# Patient Record
Sex: Female | Born: 1990 | Race: White | Hispanic: No | Smoking: Never smoker
Health system: Southern US, Community
[De-identification: ages and names within clinical notes are randomized; demographics above are authoritative.]

## PROBLEM LIST (undated history)

## (undated) ENCOUNTER — Inpatient Hospital Stay (HOSPITAL_COMMUNITY): Payer: Self-pay

## (undated) DIAGNOSIS — Z319 Encounter for procreative management, unspecified: Principal | ICD-10-CM

## (undated) DIAGNOSIS — N390 Urinary tract infection, site not specified: Principal | ICD-10-CM

## (undated) DIAGNOSIS — E669 Obesity, unspecified: Secondary | ICD-10-CM

## (undated) DIAGNOSIS — R12 Heartburn: Secondary | ICD-10-CM

## (undated) DIAGNOSIS — D649 Anemia, unspecified: Secondary | ICD-10-CM

## (undated) DIAGNOSIS — O26899 Other specified pregnancy related conditions, unspecified trimester: Secondary | ICD-10-CM

## (undated) HISTORY — DX: Obesity, unspecified: E66.9

## (undated) HISTORY — DX: Encounter for procreative management, unspecified: Z31.9

## (undated) HISTORY — PX: WISDOM TOOTH EXTRACTION: SHX21

## (undated) HISTORY — DX: Urinary tract infection, site not specified: N39.0

---
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
## 2010-02-18 ENCOUNTER — Emergency Department (HOSPITAL_COMMUNITY): Admission: EM | Admit: 2010-02-18 | Discharge: 2010-02-18 | Payer: Self-pay | Admitting: Emergency Medicine

## 2010-06-19 LAB — URINALYSIS, ROUTINE W REFLEX MICROSCOPIC
Glucose, UA: NEGATIVE mg/dL
Ketones, ur: 15 mg/dL — AB
Protein, ur: 100 mg/dL — AB
Specific Gravity, Urine: >1.03 — ABNORMAL HIGH (ref 1.005–1.030)
Urobilinogen, UA: 0.2 mg/dL (ref 0.0–1.0)

## 2010-06-19 LAB — URINE MICROSCOPIC-ADD ON

## 2010-09-30 ENCOUNTER — Emergency Department (HOSPITAL_COMMUNITY)
Admission: EM | Admit: 2010-09-30 | Discharge: 2010-09-30 | Disposition: A | Discharge status: Home / Self Care | Payer: 59 | Attending: Emergency Medicine | Admitting: Emergency Medicine

## 2010-09-30 DIAGNOSIS — N390 Urinary tract infection, site not specified: Secondary | ICD-10-CM | POA: Insufficient documentation

## 2010-09-30 DIAGNOSIS — R300 Dysuria: Secondary | ICD-10-CM | POA: Insufficient documentation

## 2010-09-30 LAB — URINALYSIS, ROUTINE W REFLEX MICROSCOPIC
Ketones, ur: NEGATIVE mg/dL
Leukocytes, UA: NEGATIVE
Protein, ur: 30 mg/dL — AB
Specific Gravity, Urine: 1.02 (ref 1.005–1.030)
pH: 7 (ref 5.0–8.0)

## 2010-09-30 LAB — URINE MICROSCOPIC-ADD ON

## 2010-09-30 LAB — PREGNANCY, URINE: Preg Test, Ur: NEGATIVE

## 2010-10-03 LAB — URINE CULTURE

## 2012-02-11 ENCOUNTER — Other Ambulatory Visit (HOSPITAL_COMMUNITY)
Admission: RE | Admit: 2012-02-11 | Discharge: 2012-02-11 | Disposition: A | Discharge status: Home / Self Care | Payer: BC Managed Care – PPO | Source: Ambulatory Visit | Attending: Obstetrics and Gynecology | Admitting: Obstetrics and Gynecology

## 2012-02-11 DIAGNOSIS — Z01419 Encounter for gynecological examination (general) (routine) without abnormal findings: Secondary | ICD-10-CM | POA: Insufficient documentation

## 2012-10-12 ENCOUNTER — Telehealth: Payer: Self-pay | Admitting: Adult Health

## 2012-10-13 NOTE — Telephone Encounter (Signed)
Pt said she has been trying to get pregnant without success, she started 7/4 and is going to come in 7/24 for a progesterone level.We weill talk after that with treatment options to get pregnant.

## 2012-10-29 ENCOUNTER — Other Ambulatory Visit: Payer: BC Managed Care – PPO

## 2012-10-29 DIAGNOSIS — IMO0002 Reserved for concepts with insufficient information to code with codable children: Secondary | ICD-10-CM

## 2012-10-29 LAB — PROGESTERONE: Progesterone: 4.9 ng/mL

## 2012-10-30 ENCOUNTER — Telehealth: Payer: Self-pay | Admitting: Obstetrics and Gynecology

## 2012-10-30 NOTE — Telephone Encounter (Signed)
Spoke with pt. Progesterone is 4.9. Spoke with JAG. Pt wants to start Clomid so appt was scheduled to see JAG. JSY

## 2012-11-06 ENCOUNTER — Encounter: Payer: Self-pay | Admitting: Adult Health

## 2012-11-06 ENCOUNTER — Ambulatory Visit (INDEPENDENT_AMBULATORY_CARE_PROVIDER_SITE_OTHER): Payer: BC Managed Care – PPO | Admitting: Adult Health

## 2012-11-06 VITALS — BP 140/84 | Ht 70.0 in | Wt 262.0 lb

## 2012-11-06 DIAGNOSIS — Z319 Encounter for procreative management, unspecified: Secondary | ICD-10-CM

## 2012-11-06 DIAGNOSIS — N979 Female infertility, unspecified: Secondary | ICD-10-CM

## 2012-11-06 MED ORDER — CLOMIPHENE CITRATE 50 MG PO TABS: ORAL_TABLET | ORAL | Status: DC

## 2012-11-06 NOTE — Patient Instructions (Addendum)
Take clomid  Have sex Follow up august 21 for progesterone level

## 2012-11-06 NOTE — Progress Notes (Signed)
Subjective:     Patient ID: Megan Barton, female   DOB: [birth year], 22 y.o.   MRN: [id number]  HPI Megan Barton is in to discuss taking clomid her progesterone level was 4.9 on 7/25.and she is taking prenatals and having sex.  Review of Systems See HPI Reviewed past medical,surgical, social and family history. Reviewed medications and allergies.     Objective:   Physical Exam BP 140/84  Ht 5\' 10"  (1.778 m)  Wt 262 lb (118.842 kg)  BMI 37.59 kg/m2  LMP 11/06/2012   will rx clomid 50 mg 1 on day 3-7 of cycle and have sex every other day, day 7-24 of  Cycle and check progesterone level 8/21.  Assessment:     Desires pregnancy     Plan:      Rx clomid 50 mg # 5 with 2 refills, tale 1 daily  Days 3-7 of cycle   Check progesterone level August 21 Have sex as discussed

## 2012-11-26 ENCOUNTER — Other Ambulatory Visit: Payer: BC Managed Care – PPO

## 2012-11-26 DIAGNOSIS — IMO0002 Reserved for concepts with insufficient information to code with codable children: Secondary | ICD-10-CM

## 2012-11-27 ENCOUNTER — Telehealth: Payer: Self-pay | Admitting: Adult Health

## 2012-11-27 ENCOUNTER — Telehealth: Payer: Self-pay | Admitting: Obstetrics & Gynecology

## 2012-11-27 LAB — PROGESTERONE: Progesterone: 22.4 ng/mL

## 2012-11-27 NOTE — Telephone Encounter (Signed)
Spoke with pt. Progesterone result from yesterday was 22.4. On Clomid 50 mg. This is the 1st month of being on Clomid. Last period was 11/06/12. Spoke with Dr. Despina Hidden. Advised to wait and see if she starts in September. Advised to call us and let us know. Pt voiced understanding. JSY

## 2012-11-27 NOTE — Telephone Encounter (Signed)
Left message that progesterone level was 22.4 which is good

## 2012-12-01 ENCOUNTER — Telehealth: Payer: Self-pay | Admitting: Adult Health

## 2012-12-01 NOTE — Telephone Encounter (Signed)
LMP 8/1 took clomid spotting now pink to brown, just watch if not red by Friday check pregnancy test

## 2012-12-01 NOTE — Telephone Encounter (Signed)
Spoke with pt. Has had dark brown spotting yesterday and today. Urine pregnancy test was negative this am. When does she start Clomid again?

## 2012-12-02 ENCOUNTER — Telehealth: Payer: Self-pay | Admitting: Adult Health

## 2012-12-02 NOTE — Telephone Encounter (Signed)
Spotted the past few days, now having brownish bleeding and sometimes having bright red bleeding, but nothing hits the pad. Having some cramping. Not sure what to do. I spoke with JAG and she advised to wait and take a UPT on Friday if no bright red bleeding, and if bright red bleeding start the clomid. Pt verbalized understanding.

## 2012-12-09 ENCOUNTER — Telehealth: Payer: Self-pay | Admitting: Adult Health

## 2012-12-09 NOTE — Telephone Encounter (Signed)
Get progesterone level 9/17

## 2012-12-23 ENCOUNTER — Other Ambulatory Visit: Payer: BC Managed Care – PPO

## 2012-12-23 DIAGNOSIS — Z319 Encounter for procreative management, unspecified: Secondary | ICD-10-CM

## 2012-12-24 ENCOUNTER — Telehealth: Payer: Self-pay | Admitting: Adult Health

## 2012-12-24 NOTE — Telephone Encounter (Signed)
Pt aware of labs  

## 2012-12-28 ENCOUNTER — Telehealth: Payer: Self-pay | Admitting: Adult Health

## 2012-12-28 NOTE — Telephone Encounter (Signed)
Pt wants to discuss blood work from work, and wants to start taking phentermine. Pt states she thinks her wt has something to do with not getting pregnant.  Spoke with JAG and she advised the pt make an appointment. Pt verbalized understanding. Appointment given

## 2012-12-28 NOTE — Telephone Encounter (Signed)
Keep appt next week

## 2013-01-06 ENCOUNTER — Encounter: Payer: Self-pay | Admitting: Adult Health

## 2013-01-06 ENCOUNTER — Ambulatory Visit (INDEPENDENT_AMBULATORY_CARE_PROVIDER_SITE_OTHER): Payer: BC Managed Care – PPO | Admitting: Adult Health

## 2013-01-06 VITALS — BP 138/78 | Ht 70.0 in | Wt 264.0 lb

## 2013-01-06 DIAGNOSIS — N390 Urinary tract infection, site not specified: Secondary | ICD-10-CM | POA: Insufficient documentation

## 2013-01-06 DIAGNOSIS — E669 Obesity, unspecified: Secondary | ICD-10-CM

## 2013-01-06 DIAGNOSIS — Z319 Encounter for procreative management, unspecified: Secondary | ICD-10-CM

## 2013-01-06 HISTORY — DX: Urinary tract infection, site not specified: N39.0

## 2013-01-06 HISTORY — DX: Obesity, unspecified: E66.9

## 2013-01-06 MED ORDER — NITROFURANTOIN MONOHYD MACRO 100 MG PO CAPS: 100.0000 mg | ORAL_CAPSULE | Freq: Two times a day (BID) | ORAL | Status: DC

## 2013-01-06 NOTE — Patient Instructions (Addendum)
Call in follow up  Calorie Counting Diet A calorie counting diet requires you to eat the number of calories that are right for you in a day. Calories are the measurement of how much energy you get from the food you eat. Eating the right amount of calories is important for staying at a healthy weight. If you eat too many calories, your body will store them as fat and you may gain weight. If you eat too few calories, you may lose weight. Counting the number of calories you eat during a day will help you know if you are eating the right amount. A Registered Dietitian can determine how many calories you need in a day. The amount of calories needed varies from person to person. If your goal is to lose weight, you will need to eat fewer calories. Losing weight can benefit you if you are overweight or have health problems such as heart disease, high blood pressure, or diabetes. If your goal is to gain weight, you will need to eat more calories. Gaining weight may be necessary if you have a certain health problem that causes your body to need more energy. TIPS Whether you are increasing or decreasing the number of calories you eat duri ng a day, it may be hard to get used to changes in what you eat and drink. The following are tips to help you keep track of the number of calories you eat.  Measure foods at home with measuring cups. This helps you know the amount of food and number of calories you are eating.  Restaurants often serve food in amounts that are larger than 1 serving. While eating out, estimate how many servings of a food you are given. For example, a serving of cooked rice is  cup or about the size of half of a fist. Knowing serving sizes will help you be aware of how much food you are eating at restaurants.  Ask for smaller portion sizes or child-size portions at restaurants.  Plan to eat half of a meal at a restaurant. Take the rest home or share the other half with a friend.  Read the  Nutrition Facts panel on food labels for calorie content and serving size. You can find out how many servings are in a package, the size of a serving, and the number of calories each serving has.  For example, a package might contain 3 cookies. The Nutrition Facts panel on that package says that 1 serving is 1 cookie. Below that, it will say there are 3 servings in the container. The calories section of the Nutrition Facts label says there are 90 calories. This means there are 90 calories in 1 cookie (1 serving). If you eat 1 cookie you have eaten 90 calories. If you eat all 3 cookies, you have eaten 270 calories (3 servings x 90 calories = 270 calories). The list below tells you how big or small some common portion sizes are.  1 oz.........4 stacked dice.  3 oz........Marland KitchenDeck of cards.  1 tsp.......Marland KitchenTip of little finger.  1 tbs......Marland KitchenMarland KitchenThumb.  2 tbs.......Marland KitchenGolf ball.   cup......Marland KitchenHalf of a fist.  1 cup.......Marland KitchenA fist. KEEP A FOOD LOG Write down every food item you eat, the amount you eat, and the number of calories in each food you eat during the day. At the end of the day, you can add up the total number of calories you have eaten. It may help to keep a list like the one below. Find out  the calorie information by reading the Nutrition Facts panel on food labels. Breakfast  Bran cereal (1 cup, 110 calories).  Fat-free milk ( cup, 45 calories). Snack  Apple (1 medium, 80 calories). Lunch  Spinach (1 cup, 20 calories).  Tomato ( medium, 20 calories).  Chicken breast strips (3 oz, 165 calories).  Shredded cheddar cheese ( cup, 110 calories).  Light [place] dressing (2 tbs, 60 calories).  Whole-wheat bread (1 slice, 80 calories).  Tub margarine (1 tsp, 35 calories).  Vegetable soup (1 cup, 160 calories). Dinner  Pork chop (3 oz, 190 calories).  Brown rice (1 cup, 215 calories).  Steamed broccoli ( cup, 20 calories).  Strawberries (1  cup, 65 calories).  Whipped  cream (1 tbs, 50 calories). Daily Calorie Total: 1425 Document Released: 03/25/2005 Document Revised: 06/17/2011 Document Reviewed: 09/19/2006 [facility] Patient Information 2014 [place], [place].

## 2013-01-06 NOTE — Progress Notes (Signed)
Subjective:     Patient ID: Megan Barton, female   DOB: 22-Feb-1991, 22 y.o.   MRN: [id number]  HPI Megan Barton is a 22 year old white female who has taken clomid and had progesterone levels that showed that she ovulated but she has not gotten pregnant yet.She had labs drawn and her A1c was 6.2 and her Vitamin D was 24.She did not take clomid this month and wants to try to lose weight.Has had UTI symptoms and took AZO.  Review of Systems See HPI Reviewed past medical,surgical, social and family history. Reviewed medications and allergies.     Objective:   Physical Exam BP 138/78  Ht 5\' 10"  (1.778 m)  Wt 264 lb (119.75 kg)  BMI 37.88 kg/m2  LMP 12/28/2012   urine orange discussed weight loss options and she will try low carb diet, take folic acid or prenatal, continue with timed sex, discussed could take 6-18 months  Assessment:     UTI Obesity Desires pregnancy    Plan:     Rx macrobid 1 bid x 7 days Take folic acid Get progesterone level about October 13 can call back for appt   Review handout on weight loss and getting pregnant

## 2013-01-18 ENCOUNTER — Other Ambulatory Visit: Payer: BC Managed Care – PPO

## 2013-01-18 DIAGNOSIS — Z319 Encounter for procreative management, unspecified: Secondary | ICD-10-CM

## 2013-01-19 ENCOUNTER — Telehealth: Payer: Self-pay | Admitting: Adult Health

## 2013-01-19 LAB — PROGESTERONE: Progesterone: 6.5 ng/mL

## 2013-01-19 NOTE — Telephone Encounter (Signed)
Left message that she could work on diet but if she wanted to try clomid again let us know

## 2013-01-19 NOTE — Telephone Encounter (Signed)
Pt aware of results. Pt stated that she would like to take the clomid again, she just wants to make sure what dosage.

## 2013-02-22 ENCOUNTER — Telehealth: Payer: Self-pay | Admitting: Obstetrics and Gynecology

## 2013-02-22 MED ORDER — CLOMIPHENE CITRATE 50 MG PO TABS: ORAL_TABLET | ORAL | Status: DC

## 2013-02-22 NOTE — Telephone Encounter (Signed)
Pt states started period 3 days ago, was wanting to know did Cyril Mourning, NP going to increase clomid dose?

## 2013-02-22 NOTE — Telephone Encounter (Signed)
Pt wants try clomid again will increase to 100 mg, check progesterone level day 21.

## 2013-03-12 ENCOUNTER — Other Ambulatory Visit: Payer: BC Managed Care – PPO

## 2013-06-09 ENCOUNTER — Other Ambulatory Visit (INDEPENDENT_AMBULATORY_CARE_PROVIDER_SITE_OTHER): Payer: BC Managed Care – PPO | Admitting: *Deleted

## 2013-06-09 DIAGNOSIS — Z3490 Encounter for supervision of normal pregnancy, unspecified, unspecified trimester: Secondary | ICD-10-CM

## 2013-06-09 DIAGNOSIS — Z3201 Encounter for pregnancy test, result positive: Secondary | ICD-10-CM

## 2013-06-09 LAB — POCT URINE PREGNANCY: Preg Test, Ur: POSITIVE

## 2013-06-09 NOTE — Progress Notes (Signed)
Patient has an LMP of May 15, 2013.  She did have a positive pregnancy test but has had some irregular bleeding so she would like to confirm her home test and do blood work to confirm as well.  She works as a Radiation protection practitionerparamedic.  She did try to conceive 18 months with her previous relationship without success but has only been trying for a few weeks this time.  She will return in one week for new ob nurse visit.

## 2013-06-10 ENCOUNTER — Telehealth: Payer: Self-pay | Admitting: *Deleted

## 2013-06-10 LAB — HCG, QUANTITATIVE, PREGNANCY: HCG, BETA CHAIN, QUANT, S: 1549.7 m[IU]/mL

## 2013-06-10 NOTE — Telephone Encounter (Signed)
Patient called to confirm her beta hcg.  Confirmed positive and she will keep her follow up appointment on March 17 for her new ob intake visit.

## 2013-06-17 ENCOUNTER — Inpatient Hospital Stay (HOSPITAL_COMMUNITY): Payer: BC Managed Care – PPO

## 2013-06-17 ENCOUNTER — Inpatient Hospital Stay (HOSPITAL_COMMUNITY)
Admission: AD | Admit: 2013-06-17 | Discharge: 2013-06-17 | Disposition: A | Discharge status: Home / Self Care | Payer: BC Managed Care – PPO | Source: Ambulatory Visit | Attending: Obstetrics & Gynecology | Admitting: Obstetrics & Gynecology

## 2013-06-17 ENCOUNTER — Encounter (HOSPITAL_COMMUNITY): Payer: Self-pay | Admitting: *Deleted

## 2013-06-17 DIAGNOSIS — O418X90 Other specified disorders of amniotic fluid and membranes, unspecified trimester, not applicable or unspecified: Secondary | ICD-10-CM

## 2013-06-17 DIAGNOSIS — O468X9 Other antepartum hemorrhage, unspecified trimester: Secondary | ICD-10-CM

## 2013-06-17 DIAGNOSIS — O43899 Other placental disorders, unspecified trimester: Principal | ICD-10-CM

## 2013-06-17 DIAGNOSIS — O4690 Antepartum hemorrhage, unspecified, unspecified trimester: Secondary | ICD-10-CM

## 2013-06-17 DIAGNOSIS — R109 Unspecified abdominal pain: Secondary | ICD-10-CM | POA: Insufficient documentation

## 2013-06-17 DIAGNOSIS — O368990 Maternal care for other specified fetal problems, unspecified trimester, not applicable or unspecified: Secondary | ICD-10-CM | POA: Insufficient documentation

## 2013-06-17 LAB — CBC
HEMATOCRIT: 38.2 % (ref 36.0–46.0)
HEMOGLOBIN: 12.4 g/dL (ref 12.0–15.0)
MCH: 28.2 pg (ref 26.0–34.0)
MCHC: 32.5 g/dL (ref 30.0–36.0)
MCV: 87 fL (ref 78.0–100.0)
Platelets: 327 10*3/uL (ref 150–400)
RBC: 4.39 MIL/uL (ref 3.87–5.11)
RDW: 13.7 % (ref 11.5–15.5)
WBC: 10.5 10*3/uL (ref 4.0–10.5)

## 2013-06-17 LAB — WET PREP, GENITAL
Trich, Wet Prep: NONE SEEN
YEAST WET PREP: NONE SEEN

## 2013-06-17 LAB — ABO/RH: ABO/RH(D): B POS

## 2013-06-17 LAB — URINALYSIS, ROUTINE W REFLEX MICROSCOPIC
Bilirubin Urine: NEGATIVE
GLUCOSE, UA: NEGATIVE mg/dL
Hgb urine dipstick: NEGATIVE
Ketones, ur: NEGATIVE mg/dL
LEUKOCYTES UA: NEGATIVE
NITRITE: NEGATIVE
PH: 6.5 (ref 5.0–8.0)
Protein, ur: NEGATIVE mg/dL
SPECIFIC GRAVITY, URINE: 1.02 (ref 1.005–1.030)
Urobilinogen, UA: 0.2 mg/dL (ref 0.0–1.0)

## 2013-06-17 LAB — HCG, QUANTITATIVE, PREGNANCY: HCG, BETA CHAIN, QUANT, S: 9405 m[IU]/mL — AB (ref <5)

## 2013-06-17 MED ORDER — PROMETHAZINE HCL 25 MG PO TABS: 25.0000 mg | ORAL_TABLET | Freq: Four times a day (QID) | ORAL | Status: DC | PRN

## 2013-06-17 NOTE — Discharge Instructions (Signed)
Threatened Miscarriage  Bleeding during the first 20 weeks of pregnancy is common. This is sometimes called a threatened miscarriage. This is a pregnancy that is threatening to end before the twentieth week of pregnancy. Often this bleeding stops with bed rest or decreased activities as suggested by your caregiver and the pregnancy continues without any more problems. You may be asked to not have sexual intercourse, have orgasms or use tampons until further notice. Sometimes a threatened miscarriage can progress to a complete or incomplete miscarriage. This may or may not require further treatment. Some miscarriages occur before a woman misses a menstrual period and knows she is pregnant.  Miscarriages occur in 15 to 20% of all pregnancies and usually occur during the first 13 weeks of the pregnancy. The exact cause of a miscarriage is usually never known. A miscarriage is natures way of ending a pregnancy that is abnormal or would not make it to term. There are some things that may put you at risk to have a miscarriage, such as:  · Hormone problems.  · Infection of the uterus or cervix.  · Chronic illness, diabetes for example, especially if it is not controlled.  · Abnormal shaped uterus.  · Fibroids in the uterus.  · Incompetent cervix (the cervix is too weak to hold the baby).  · Smoking.  · Drinking too much alcohol. It's best not to drink any alcohol when you are pregnant.  · Taking illegal drugs.  TREATMENT   When a miscarriage becomes complete and all products of conception (all the tissue in the uterus) have been passed, often no treatment is needed. If you think you passed tissue, save it in a container and take it to your doctor for evaluation. If the miscarriage is incomplete (parts of the fetus or placenta remain in the uterus), further treatment may be needed. The most common reason for further treatment is continued bleeding (hemorrhage) because pregnancy tissue did not pass out of the uterus. This  often occurs if a miscarriage is incomplete. Tissue left behind may also become infected. Treatment usually is dilatation and curettage (the removal of the remaining products of pregnancy. This can be done by a simple sucking procedure (suction curettage) or a simple scraping of the inside of the uterus. This may be done in the hospital or in the caregiver's office. This is only done when your caregiver knows that there is no chance for the pregnancy to proceed to term. This is determined by physical examination, negative pregnancy test, falling pregnancy hormone count and/or, an ultrasound revealing a dead fetus.  Miscarriages are often a very emotional time for prospective mothers and fathers. This is not you or your partners fault. It did not occur because of an inadequacy in you or your partner. Nearly all miscarriages occur because the pregnancy has started off wrongly. At least half of these pregnancies have a chromosomal abnormality. It is almost always not inherited. Others may have developmental problems with the fetus or placenta. This does not always show up even when the products miscarried are studied under the microscope. The miscarriage is nearly always not your fault and it is not likely that you could have prevented it from happening. If you are having emotional and grieving problems, talk to your health care provider and even seek counseling, if necessary, before getting pregnant again. You can begin trying for another pregnancy as soon as your caregiver says it is OK.  HOME CARE INSTRUCTIONS   · Your caregiver may order   bed rest depending on how much bleeding and cramping you are having. You may be limited to only getting up to go to the bathroom. You may be allowed to continue light activity. You may need to make arrangements for the care of your other children and for any other responsibilities.  · Keep track of the number of pads you use each day, how often you have to change pads and how  saturated (soaked) they are. Record this information.  · DO NOT USE TAMPONS. Do not douche, have sexual intercourse or orgasms until approved by your caregiver.  · You may receive a follow up appointment for re-evaluation of your pregnancy and a repeat blood test. Re-evaluation often occurs after 2 days and again in 4 to 6 weeks. It is very important that you follow-up in the recommended time period.  · If you are Rh negative and the father is Rh positive or you do not know the fathers' blood type, you may receive a shot (Rh immune globulin) to help prevent abnormal antibodies that can develop and affect the baby in any future pregnancies.  SEEK IMMEDIATE MEDICAL CARE IF:  · You have severe cramps in your stomach, back, or abdomen.  · You have a sudden onset of severe pain in the lower part of your abdomen.  · You develop chills.  · You run an unexplained temperature of 101° F (38.3° C) or higher.  · You pass large clots or tissue. Save any tissue for your caregiver to inspect.  · Your bleeding increases or you become light-headed, weak, or have fainting episodes.  · You have a gush of fluid from your vagina.  · You pass out. This could mean you have a tubal (ectopic) pregnancy.  Document Released: 03/25/2005 Document Revised: 06/17/2011 Document Reviewed: 11/09/2007  [employer]® Patient Information ©2014 [employer].

## 2013-06-17 NOTE — MAU Note (Signed)
Patient states she has been having abdominal cramping for about one week. States she started having pink spotting last night that has continued this am. No bleeding on arrival to MAU.

## 2013-06-17 NOTE — MAU Provider Note (Signed)
Attestation of Attending Supervision of Advanced Practitioner (PA/CNM/NP): Evaluation and management procedures were performed by the Advanced Practitioner under my supervision and collaboration.  I have reviewed the Advanced Practitioner's note and chart, and I agree with the management and plan.  Oumou Smead, MD, FACOG Attending Obstetrician & Gynecologist Faculty Practice, [facility]  

## 2013-06-17 NOTE — MAU Provider Note (Signed)
History     CSN: [id number]  Arrival date and time: 06/17/13 1025   First Provider Initiated Contact with Patient 06/17/13 1150      Chief Complaint  Patient presents with  . Vaginal Bleeding  . Abdominal Pain   HPI  Pt is a 23 yo G1P0 [redacted] week pregnant female with a history of UTI presenting with 1 week of cramps and 1 day of vaginal spotting. Pt states that she has noticed pink blood on the TP over the past day when she has wiped. She has noticed no clots. Pt has a past hx of UTI however has not had any of the symptoms consistent with past UTI. She does admit to intercourse 3 days ago. She has no hx of STI. Pt also endorses some diffuse cramping over the past week. She has had normal bowel movements and does not feel constipated. Nausea but no emesis.   Past Medical History  Diagnosis Date  . Patient desires pregnancy 11/06/2012  . UTI (lower urinary tract infection) 01/06/2013    Past Surgical History  Procedure Laterality Date  . Wisdom tooth extraction      Family History  Problem Relation Age of Onset  . Diabetes Mother   . Hypertension Father   . Asthma Brother   . Cancer Paternal Aunt     breast  . Cancer Paternal Grandfather     colon, liver    History  Substance Use Topics  . Smoking status: Never Smoker   . Smokeless tobacco: Never Used  . Alcohol Use: No    Allergies:  Allergies  Allergen Reactions  . Keflex [Cephalexin] Rash    Prescriptions prior to admission  Medication Sig Dispense Refill  . Prenatal Vit-Min-FA-Fish Oil ([facility] PRENATAL GUMMY PO) Take 2 tablets by mouth daily.        Review of Systems  Constitutional: Negative for fever and chills.  Respiratory: Negative for shortness of breath.   Gastrointestinal: Positive for abdominal pain.  Genitourinary: Positive for hematuria (blood on toilet paper ). Negative for dysuria, urgency and frequency.  Neurological: Negative for headaches.   Physical Exam   Blood pressure 135/68, pulse 92,  temperature 98.4 F (36.9 C), temperature source Oral, resp. rate 16, height 5\' 10"  (1.778 m), weight 116.212 kg (256 lb 3.2 oz), last menstrual period 05/15/2013, SpO2 100.00%.  Physical Exam  Constitutional: She appears well-developed and well-nourished. No distress.  HENT:  Head: Normocephalic and atraumatic.  Eyes: Pupils are equal, round, and reactive to light.  GI: Soft. Bowel sounds are normal. She exhibits no distension. There is no tenderness. There is no rebound and no guarding.  Genitourinary:  Genital:External negative Vaginal:small amount white discharge Cervix:trickle if bright red blood from cervix. closed Bimanual:nontender    Results for orders placed during the hospital encounter of 06/17/13 (from the past 24 hour(s))  URINALYSIS, ROUTINE W REFLEX MICROSCOPIC     Status: None   Collection Time    06/17/13 10:30 AM      Result Value Ref Range   Color, Urine YELLOW  YELLOW   APPearance CLEAR  CLEAR   Specific Gravity, Urine 1.020  1.005 - 1.030   pH 6.5  5.0 - 8.0   Glucose, UA NEGATIVE  NEGATIVE mg/dL   Hgb urine dipstick NEGATIVE  NEGATIVE   Bilirubin Urine NEGATIVE  NEGATIVE   Ketones, ur NEGATIVE  NEGATIVE mg/dL   Protein, ur NEGATIVE  NEGATIVE mg/dL   Urobilinogen, UA 0.2  0.0 - 1.0 mg/dL  Nitrite NEGATIVE  NEGATIVE   Leukocytes, UA NEGATIVE  NEGATIVE  CBC     Status: None   Collection Time    06/17/13 12:10 PM      Result Value Ref Range   WBC 10.5  4.0 - 10.5 K/uL   RBC 4.39  3.87 - 5.11 MIL/uL   Hemoglobin 12.4  12.0 - 15.0 g/dL   HCT 16.1  09.6 - 04.5 %   MCV 87.0  78.0 - 100.0 fL   MCH 28.2  26.0 - 34.0 pg   MCHC 32.5  30.0 - 36.0 g/dL   RDW 40.9  81.1 - 91.4 %   Platelets 327  150 - 400 K/uL  HCG, QUANTITATIVE, PREGNANCY     Status: Abnormal   Collection Time    06/17/13 12:10 PM      Result Value Ref Range   hCG, Beta Chain, Quant, S 9405 (*) <5 mIU/mL  ABO/RH     Status: None   Collection Time    06/17/13 12:10 PM      Result Value  Ref Range   ABO/RH(D) B POS    WET PREP, GENITAL     Status: Abnormal   Collection Time    06/17/13  1:13 PM      Result Value Ref Range   Yeast Wet Prep HPF POC NONE SEEN  NONE SEEN   Trich, Wet Prep NONE SEEN  NONE SEEN   Clue Cells Wet Prep HPF POC FEW (*) NONE SEEN   WBC, Wet Prep HPF POC FEW (*) NONE SEEN   .  US Ob Comp Less 14 Wks  06/17/2013   CLINICAL DATA:  Four weeks pregnant with spotting.  EXAM: OBSTETRIC <14 WK [place] AND TRANSVAGINAL OB US  TECHNIQUE: Both transabdominal and transvaginal ultrasound examinations were performed for complete evaluation of the gestation as well as the maternal uterus, adnexal regions, and pelvic cul-de-sac. Transvaginal technique was performed to assess early pregnancy.  COMPARISON:  None.  FINDINGS: Intrauterine gestational sac: Visualized/normal in shape.  Yolk sac:  Present  Embryo:  Present  Cardiac Activity: Present  Heart Rate:  116 bpm  CRL:   3.6  mm   6 w 1d                  [place] EDC: 02/09/2014  Maternal uterus/adnexae: There is a small subchorionic hematoma, 1 cm in maximal length by up to 5 mm in thickness. No acute adnexal findings. There is a 2 cm cystic structure in the right ovary which is likely a follicular cyst. No adnexal mass or free pelvic fluid.  IMPRESSION: 1. Single, living intrauterine gestation - estimated age 70 weeks 1 day. 2. Small subchorionic hematoma.   Electronically Signed   By: Tiburcio Pea M.D.   On: 06/17/2013 12:56   US Ob Transvaginal  06/17/2013   CLINICAL DATA:  Four weeks pregnant with spotting.  EXAM: OBSTETRIC <14 WK [place] AND TRANSVAGINAL OB US  TECHNIQUE: Both transabdominal and transvaginal ultrasound examinations were performed for complete evaluation of the gestation as well as the maternal uterus, adnexal regions, and pelvic cul-de-sac. Transvaginal technique was performed to assess early pregnancy.  COMPARISON:  None.  FINDINGS: Intrauterine gestational sac: Visualized/normal in shape.  Yolk sac:  Present   Embryo:  Present  Cardiac Activity: Present  Heart Rate:  116 bpm  CRL:   3.6  mm   6 w 1d  [place] EDC: 02/09/2014  Maternal uterus/adnexae: There is a small subchorionic hematoma, 1 cm in maximal length by up to 5 mm in thickness. No acute adnexal findings. There is a 2 cm cystic structure in the right ovary which is likely a follicular cyst. No adnexal mass or free pelvic fluid.  IMPRESSION: 1. Single, living intrauterine gestation - estimated age 9 weeks 1 day. 2. Small subchorionic hematoma.   Electronically Signed   By: Tiburcio Pea M.D.   On: 06/17/2013 12:56    MAU Course  Procedures  MDM  Wet prep, GC, Chlamydia, CBC, UA, U/S, ABORh, Quant   Assessment and Plan   A: Bleeding in early pregnancy Small Subchorionic hematoma   P: Above orders Pelvic rest Phenergan 25 mg po q 6 hours  Follow up at [facility] 06/17/2013, 1:42 PM

## 2013-06-18 LAB — GC/CHLAMYDIA PROBE AMP
CT Probe RNA: NEGATIVE
GC Probe RNA: NEGATIVE

## 2013-06-19 LAB — HIV ANTIBODY (ROUTINE TESTING W REFLEX): HIV: NONREACTIVE

## 2013-06-22 ENCOUNTER — Encounter: Payer: Self-pay | Admitting: Obstetrics & Gynecology

## 2013-06-22 ENCOUNTER — Ambulatory Visit (INDEPENDENT_AMBULATORY_CARE_PROVIDER_SITE_OTHER): Payer: BC Managed Care – PPO | Admitting: Obstetrics & Gynecology

## 2013-06-22 VITALS — BP 127/81 | Wt 257.0 lb

## 2013-06-22 DIAGNOSIS — Z113 Encounter for screening for infections with a predominantly sexual mode of transmission: Secondary | ICD-10-CM

## 2013-06-22 DIAGNOSIS — Z124 Encounter for screening for malignant neoplasm of cervix: Secondary | ICD-10-CM

## 2013-06-22 DIAGNOSIS — Z3400 Encounter for supervision of normal first pregnancy, unspecified trimester: Secondary | ICD-10-CM

## 2013-06-22 DIAGNOSIS — O209 Hemorrhage in early pregnancy, unspecified: Secondary | ICD-10-CM

## 2013-06-22 DIAGNOSIS — O4690 Antepartum hemorrhage, unspecified, unspecified trimester: Secondary | ICD-10-CM

## 2013-06-22 DIAGNOSIS — Z3480 Encounter for supervision of other normal pregnancy, unspecified trimester: Secondary | ICD-10-CM

## 2013-06-22 NOTE — Patient Instructions (Signed)
Vaginal Bleeding During Pregnancy, First Trimester °A small amount of bleeding (spotting) from the vagina is relatively common in early pregnancy. It usually stops on its own. Various things may cause bleeding or spotting in early pregnancy. Some bleeding may be related to the pregnancy, and some may not. In most cases, the bleeding is normal and is not a problem. However, bleeding can also be a sign of something serious. Be sure to tell your health care provider about any vaginal bleeding right away. °Some possible causes of vaginal bleeding during the first trimester include: °· Infection or inflammation of the cervix. °· Growths (polyps) on the cervix. °· Miscarriage or threatened miscarriage. °· Pregnancy tissue has developed outside of the uterus and in a fallopian tube (tubal pregnancy). °· Tiny cysts have developed in the uterus instead of pregnancy tissue (molar pregnancy). °HOME CARE INSTRUCTIONS  °Watch your condition for any changes. The following actions may help to lessen any discomfort you are feeling: °· Follow your health care provider's instructions for limiting your activity. If your health care provider orders bed rest, you may need to stay in bed and only get up to use the bathroom. However, your health care provider may allow you to continue light activity. °· If needed, make plans for someone to help with your regular activities and responsibilities while you are on bed rest. °· Keep track of the number of pads you use each day, how often you change pads, and how soaked (saturated) they are. Write this down. °· Do not use tampons. Do not douche. °· Do not have sexual intercourse or orgasms until approved by your health care provider. °· If you pass any tissue from your vagina, save the tissue so you can show it to your health care provider. °· Only take over-the-counter or prescription medicines as directed by your health care provider. °· Do not take aspirin because it can make you  bleed. °· Keep all follow-up appointments as directed by your health care provider. °SEEK MEDICAL CARE IF: °· You have any vaginal bleeding during any part of your pregnancy. °· You have cramps or labor pains. °SEEK IMMEDIATE MEDICAL CARE IF:  °· You have severe cramps in your back or belly (abdomen). °· You have a fever, not controlled by medicine. °· You pass large clots or tissue from your vagina. °· Your bleeding increases. °· You feel lightheaded or weak, or you have fainting episodes. °· You have chills. °· You are leaking fluid or have a gush of fluid from your vagina. °· You pass out while having a bowel movement. °MAKE SURE YOU: °· Understand these instructions. °· Will watch your condition. °· Will get help right away if you are not doing well or get worse. °Document Released: 01/02/2005 Document Revised: 01/13/2013 Document Reviewed: 11/30/2012 °Exit[employer]® Patient Information ©2014 [employer]. ° °

## 2013-06-22 NOTE — Progress Notes (Signed)
P-101.  Pt went to MAU on 3/12 for spotting.  Note in system.

## 2013-06-22 NOTE — Progress Notes (Addendum)
Subjective:    Megan Barton is being seen today for her first obstetrical visit.  This is not a planned pregnancy. She is at 6044w6d gestation. Her obstetrical history is significant for obesity. Relationship with FOB: significant other, living together. Patient does intend to breast feed. Pregnancy history fully reviewed. Pt was previously being evaluated for infertility with her spouse and when they broke up she was pregnanct in 2 months by her new S.O.  Was seen in the MAU for bleeding dx'd with subchorionic hemorrhage. Had increased bleeding with a small clot this am.  Menstrual History: OB History   Grav Para Term Preterm Abortions TAB SAB Ect Mult Living   1                Patient's last menstrual period was 05/15/2013.    The following portions of the patient's history were reviewed and updated as appropriate: allergies, current medications, past family history, past medical history, past social history, past surgical history and problem list.  Review of Systems Pertinent items are noted in HPI.    Objective:    BP 127/81  Wt 257 lb (116.574 kg)  LMP 05/15/2013  General Appearance:    Alert, cooperative, no distress, appears stated age                 Neck:   Supple, symmetrical, trachea midline, no adenopathy;    thyroid:  no enlargement/tenderness/nodules; no carotid   bruit or JVD  Back:     Symmetric, no curvature, ROM normal, no CVA tenderness  Lungs:     Clear to auscultation bilaterally, respirations unlabored  Chest Wall:    No tenderness or deformity   Heart:    Regular rate and rhythm, S1 and S2 normal, no murmur, rub   or gallop  Breast Exam:    No tenderness, masses, or nipple abnormality  Abdomen:     Soft, non-tender, bowel sounds active all four quadrants,    no masses, no organomegaly  Genitalia:    Normal female without lesion, discharge or tenderness; small amount of blood in vault. Cervix friable     Extremities:   Extremities normal,  atraumatic, no cyanosis or edema  Pulses:   2+ and symmetric all extremities  Skin:   Skin color, texture, turgor normal, no rashes or lesions    Sono: IUP with +FHR;           Assessment:    Pregnancy at 6 and 6/7 weeks  1st trimester bleeding- IUP reconfirmed today   Plan:    Initial labs drawn. Prenatal vitamins. Problem list reviewed and updated. First trimester screen discussed: ordered Role of ultrasound in pregnancy discussed; fetal survey: to be ordered later. Follow up in 4 weeks. 60% of 60 min visit spent on counseling and coordination of care.  Watch for s/sx of SAB

## 2013-06-23 NOTE — Addendum Note (Signed)
Addended by: Barbara CowerNOGUES, Jenel Gierke L on: 06/23/2013 08:56 AM   Modules accepted: Orders

## 2013-07-14 ENCOUNTER — Other Ambulatory Visit: Payer: Self-pay | Admitting: Obstetrics & Gynecology

## 2013-07-14 DIAGNOSIS — Z3682 Encounter for antenatal screening for nuchal translucency: Secondary | ICD-10-CM

## 2013-07-21 ENCOUNTER — Encounter: Payer: Self-pay | Admitting: Obstetrics & Gynecology

## 2013-07-21 ENCOUNTER — Ambulatory Visit (INDEPENDENT_AMBULATORY_CARE_PROVIDER_SITE_OTHER): Payer: BC Managed Care – PPO | Admitting: Obstetrics & Gynecology

## 2013-07-21 VITALS — BP 109/78 | Wt 255.0 lb

## 2013-07-21 DIAGNOSIS — E669 Obesity, unspecified: Secondary | ICD-10-CM

## 2013-07-21 DIAGNOSIS — Z3400 Encounter for supervision of normal first pregnancy, unspecified trimester: Secondary | ICD-10-CM

## 2013-07-21 DIAGNOSIS — O99210 Obesity complicating pregnancy, unspecified trimester: Secondary | ICD-10-CM

## 2013-07-21 NOTE — Progress Notes (Signed)
P-87 Patient is no longer having bleeding, and she is doing well.

## 2013-07-21 NOTE — Patient Instructions (Signed)
Return to clinic for any obstetric concerns or go to MAU for evaluation  

## 2013-07-21 NOTE — Progress Notes (Signed)
Patient is doing well, no further bleeding. Already scheduled for first trimester screen on 07/30/13, then will have AFP draw next visit. Will also have 1 hr GTT at that visit due to BMI of 37.  Anatomy scan ordered for 19-20 weeks.  No other complaints or concerns.  Routine obstetric precautions reviewed.

## 2013-07-30 ENCOUNTER — Encounter (HOSPITAL_COMMUNITY): Payer: Self-pay

## 2013-07-30 ENCOUNTER — Ambulatory Visit (HOSPITAL_COMMUNITY)
Admission: RE | Admit: 2013-07-30 | Discharge: 2013-07-30 | Disposition: A | Discharge status: Home / Self Care | Payer: BC Managed Care – PPO | Source: Ambulatory Visit | Attending: Obstetrics & Gynecology | Admitting: Obstetrics & Gynecology

## 2013-07-30 ENCOUNTER — Ambulatory Visit (HOSPITAL_COMMUNITY): Admission: RE | Admit: 2013-07-30 | Payer: BC Managed Care – PPO | Source: Ambulatory Visit

## 2013-07-30 ENCOUNTER — Other Ambulatory Visit: Payer: Self-pay | Admitting: Obstetrics & Gynecology

## 2013-07-30 DIAGNOSIS — Z36 Encounter for antenatal screening of mother: Secondary | ICD-10-CM | POA: Insufficient documentation

## 2013-07-30 DIAGNOSIS — Z3682 Encounter for antenatal screening for nuchal translucency: Secondary | ICD-10-CM

## 2013-08-04 ENCOUNTER — Ambulatory Visit (HOSPITAL_COMMUNITY)
Admission: RE | Admit: 2013-08-04 | Discharge: 2013-08-04 | Disposition: A | Discharge status: Home / Self Care | Payer: BC Managed Care – PPO | Source: Ambulatory Visit | Attending: Obstetrics & Gynecology | Admitting: Obstetrics & Gynecology

## 2013-08-04 ENCOUNTER — Ambulatory Visit (HOSPITAL_COMMUNITY): Admission: RE | Admit: 2013-08-04 | Payer: BC Managed Care – PPO | Source: Ambulatory Visit

## 2013-08-04 VITALS — BP 122/69 | HR 98 | Wt 258.0 lb

## 2013-08-04 DIAGNOSIS — Z36 Encounter for antenatal screening of mother: Secondary | ICD-10-CM | POA: Insufficient documentation

## 2013-08-04 DIAGNOSIS — Z3682 Encounter for antenatal screening for nuchal translucency: Secondary | ICD-10-CM

## 2013-08-04 DIAGNOSIS — Z3400 Encounter for supervision of normal first pregnancy, unspecified trimester: Secondary | ICD-10-CM

## 2013-08-18 ENCOUNTER — Encounter: Payer: BC Managed Care – PPO | Admitting: Family Medicine

## 2013-08-23 LAB — OB RESULTS CONSOLE ANTIBODY SCREEN: Antibody Screen: NEGATIVE

## 2013-08-23 LAB — OB RESULTS CONSOLE HEPATITIS B SURFACE ANTIGEN: Hepatitis B Surface Ag: NEGATIVE

## 2013-08-23 LAB — OB RESULTS CONSOLE RUBELLA ANTIBODY, IGM: Rubella: IMMUNE

## 2013-08-23 LAB — OB RESULTS CONSOLE HIV ANTIBODY (ROUTINE TESTING): HIV: NONREACTIVE

## 2013-08-23 LAB — OB RESULTS CONSOLE RPR: RPR: NONREACTIVE

## 2013-08-27 ENCOUNTER — Other Ambulatory Visit: Payer: Self-pay

## 2013-09-15 ENCOUNTER — Ambulatory Visit (HOSPITAL_COMMUNITY): Payer: BC Managed Care – PPO

## 2013-10-28 ENCOUNTER — Encounter: Payer: Self-pay | Admitting: Obstetrics & Gynecology

## 2013-10-28 ENCOUNTER — Encounter (HOSPITAL_COMMUNITY): Payer: Self-pay

## 2013-10-28 ENCOUNTER — Ambulatory Visit (HOSPITAL_COMMUNITY)
Admission: RE | Admit: 2013-10-28 | Discharge: 2013-10-28 | Disposition: A | Discharge status: Home / Self Care | Payer: BC Managed Care – PPO | Source: Ambulatory Visit | Attending: Obstetrics and Gynecology | Admitting: Obstetrics and Gynecology

## 2013-10-28 DIAGNOSIS — Z3689 Encounter for other specified antenatal screening: Secondary | ICD-10-CM | POA: Insufficient documentation

## 2013-10-28 DIAGNOSIS — Z3400 Encounter for supervision of normal first pregnancy, unspecified trimester: Secondary | ICD-10-CM

## 2014-02-07 ENCOUNTER — Encounter (HOSPITAL_COMMUNITY): Payer: Self-pay

## 2014-02-08 ENCOUNTER — Encounter (HOSPITAL_COMMUNITY)
Admission: RE | Admit: 2014-02-08 | Discharge: 2014-02-08 | Disposition: A | Discharge status: Home / Self Care | Payer: BC Managed Care – PPO | Source: Ambulatory Visit | Attending: Obstetrics & Gynecology | Admitting: Obstetrics & Gynecology

## 2014-02-08 ENCOUNTER — Encounter (HOSPITAL_COMMUNITY): Payer: Self-pay

## 2014-02-08 HISTORY — DX: Heartburn: R12

## 2014-02-08 HISTORY — DX: Other specified pregnancy related conditions, unspecified trimester: O26.899

## 2014-02-08 HISTORY — DX: Anemia, unspecified: D64.9

## 2014-02-08 LAB — CBC
HEMATOCRIT: 35 % — AB (ref 36.0–46.0)
Hemoglobin: 11.1 g/dL — ABNORMAL LOW (ref 12.0–15.0)
MCH: 26.6 pg (ref 26.0–34.0)
MCHC: 31.7 g/dL (ref 30.0–36.0)
MCV: 83.7 fL (ref 78.0–100.0)
Platelets: 260 10*3/uL (ref 150–400)
RBC: 4.18 MIL/uL (ref 3.87–5.11)
RDW: 14.6 % (ref 11.5–15.5)
WBC: 9.2 10*3/uL (ref 4.0–10.5)

## 2014-02-08 LAB — RPR

## 2014-02-08 NOTE — Patient Instructions (Addendum)
   Your procedure is scheduled on: Thursday, Nov 5  [facility] at: 6 AM Pick up the phone at the desk and dial [phone] and inform us of your arrival.  Please call this number if you have any problems the morning of surgery: [phone]  Remember: Do not eat or drink after midnight: Wednesday Take these medicines the morning of surgery with a SIP OF WATER:  None  Do not wear jewelry, make-up, or FINGER nail polish No metal in your hair or on your body. Do not wear lotions, powders, perfumes.  You may wear deodorant.  Do not bring valuables to the hospital. Contacts, dentures or bridgework may not be worn into surgery.  Leave suitcase in the car. After Surgery it may be brought to your room. For patients being admitted to the hospital, checkout time is 11:00am the day of discharge.  Home with Husband Chrissie NoaWilliam cell [phone]

## 2014-02-09 MED ORDER — DEXTROSE 5 % IV SOLN
INTRAVENOUS | Status: AC
  Administered 2014-02-10: 117.5 mL via INTRAVENOUS
  Filled 2014-02-09: qty 11.5

## 2014-02-09 NOTE — H&P (Signed)
Megan ReedsMacey R Barton is a 23 y.o. female presenting for primary C/S secondary for concern of fetal macrosomia with last EFW >9#.  [name] is offered primary C/S and accepts.  History OB History    Gravida Para Term Preterm AB TAB SAB Ectopic Multiple Living   1 0 0 0 0 0 0 0 0 0      Past Medical History  Diagnosis Date  . Patient desires pregnancy 11/06/2012    resolved  . UTI (lower urinary tract infection) 01/06/2013    resolved  . Obesity 01/06/2013  . Heartburn in pregnancy     zantac  . Anemia    Past Surgical History  Procedure Laterality Date  . Wisdom tooth extraction     Family History: family history includes Asthma in her brother; Cancer in her paternal aunt and paternal grandfather; Diabetes in her mother; Hypertension in her father. Social History:  reports that she has never smoked. She has never used smokeless tobacco. She reports that she does not drink alcohol or use illicit drugs.   Prenatal Transfer Tool  Maternal Diabetes: No Genetic Screening: Normal Maternal Ultrasounds/Referrals: Normal Fetal Ultrasounds or other Referrals:  None Maternal Substance Abuse:  No Significant Maternal Medications:  None Significant Maternal Lab Results:  Lab values include: Group B Strep negative Other Comments:  None  ROS    Height 5\' 10"  (1.778 m), weight 280 lb (127.007 kg), last menstrual period 05/15/2013. Maternal Exam:  Abdomen: Patient reports no abdominal tenderness. Fundal height is S>D.   Estimated fetal weight is 9#6.    Pelvis: of concern for delivery.      Physical Exam  Constitutional: She is oriented to person, place, and time. She appears well-developed and well-nourished.  GI: Soft. There is no rebound and no guarding.  Neurological: She is alert and oriented to person, place, and time.  Skin: Skin is warm and dry.  Psychiatric: She has a normal mood and affect. Her behavior is normal.    Prenatal labs: ABO, Rh: --/--/B POS (11/03 1335) Antibody:  NEG (11/03 1335) Rubella: Immune (05/18 0000) RPR: NON REAC (11/03 1335)  HBsAg: Negative (05/18 0000)  HIV: Non-reactive (05/18 0000)  GBS:     Assessment/Plan: 23yo G1 at 2674w0d for primary CD secondary to suspected fetal macrosomia -Primary CD  Megan Barton 02/09/2014, 9:51 PM

## 2014-02-10 ENCOUNTER — Inpatient Hospital Stay (HOSPITAL_COMMUNITY)
Admission: RE | Admit: 2014-02-10 | Discharge: 2014-02-13 | DRG: 765 | Disposition: A | Discharge status: Home / Self Care | Payer: BC Managed Care – PPO | Source: Ambulatory Visit | Attending: Obstetrics & Gynecology | Admitting: Obstetrics & Gynecology

## 2014-02-10 ENCOUNTER — Encounter (HOSPITAL_COMMUNITY)
Admission: RE | Disposition: A | Discharge status: Home / Self Care | Payer: Self-pay | Source: Ambulatory Visit | Attending: Obstetrics & Gynecology

## 2014-02-10 ENCOUNTER — Encounter (HOSPITAL_COMMUNITY): Payer: Self-pay | Admitting: Certified Registered Nurse Anesthetist

## 2014-02-10 ENCOUNTER — Inpatient Hospital Stay (HOSPITAL_COMMUNITY): Payer: BC Managed Care – PPO | Admitting: Certified Registered Nurse Anesthetist

## 2014-02-10 DIAGNOSIS — Z6841 Body Mass Index (BMI) 40.0 and over, adult: Secondary | ICD-10-CM

## 2014-02-10 DIAGNOSIS — O3663X0 Maternal care for excessive fetal growth, third trimester, not applicable or unspecified: Secondary | ICD-10-CM | POA: Diagnosis present

## 2014-02-10 DIAGNOSIS — E669 Obesity, unspecified: Secondary | ICD-10-CM | POA: Diagnosis present

## 2014-02-10 DIAGNOSIS — O99214 Obesity complicating childbirth: Secondary | ICD-10-CM | POA: Diagnosis present

## 2014-02-10 DIAGNOSIS — Z3A40 40 weeks gestation of pregnancy: Secondary | ICD-10-CM | POA: Diagnosis present

## 2014-02-10 DIAGNOSIS — Z98891 History of uterine scar from previous surgery: Secondary | ICD-10-CM

## 2014-02-10 LAB — PREPARE RBC (CROSSMATCH)

## 2014-02-10 SURGERY — Surgical Case
Anesthesia: Spinal | Site: Abdomen

## 2014-02-10 MED ORDER — NALBUPHINE HCL 10 MG/ML IJ SOLN: 5.0000 mg | INTRAMUSCULAR | Status: DC | PRN

## 2014-02-10 MED ORDER — NALOXONE HCL 1 MG/ML IJ SOLN
1.0000 ug/kg/h | INTRAVENOUS | Status: DC | PRN
  Filled 2014-02-10: qty 2

## 2014-02-10 MED ORDER — MEPERIDINE HCL 25 MG/ML IJ SOLN
6.2500 mg | INTRAMUSCULAR | Status: DC | PRN
Start: 2014-02-10 — End: 2014-02-10

## 2014-02-10 MED ORDER — TETANUS-DIPHTH-ACELL PERTUSSIS 5-2.5-18.5 LF-MCG/0.5 IM SUSP: 0.5000 mL | Freq: Once | INTRAMUSCULAR | Status: DC

## 2014-02-10 MED ORDER — HYDROMORPHONE HCL 1 MG/ML IJ SOLN: 0.2500 mg | INTRAMUSCULAR | Status: DC | PRN

## 2014-02-10 MED ORDER — 0.9 % SODIUM CHLORIDE (POUR BTL) OPTIME
TOPICAL | Status: DC | PRN
  Administered 2014-02-10: 1000 mL

## 2014-02-10 MED ORDER — ONDANSETRON HCL 4 MG PO TABS: 4.0000 mg | ORAL_TABLET | ORAL | Status: DC | PRN

## 2014-02-10 MED ORDER — LACTATED RINGERS IV SOLN
Freq: Once | INTRAVENOUS | Status: AC
  Administered 2014-02-10: 06:00:00 via INTRAVENOUS

## 2014-02-10 MED ORDER — LACTATED RINGERS IV SOLN
INTRAVENOUS | Status: DC | PRN
  Administered 2014-02-10: 08:00:00 via INTRAVENOUS

## 2014-02-10 MED ORDER — SCOPOLAMINE 1 MG/3DAYS TD PT72
1.0000 | MEDICATED_PATCH | Freq: Once | TRANSDERMAL | Status: DC
  Filled 2014-02-10: qty 1

## 2014-02-10 MED ORDER — PHENYLEPHRINE 8 MG IN D5W 100 ML (0.08MG/ML) PREMIX OPTIME
INJECTION | INTRAVENOUS | Status: DC | PRN
  Administered 2014-02-10: 60 ug/min via INTRAVENOUS

## 2014-02-10 MED ORDER — LANOLIN HYDROUS EX OINT: 1.0000 "application " | TOPICAL_OINTMENT | CUTANEOUS | Status: DC | PRN

## 2014-02-10 MED ORDER — IBUPROFEN 600 MG PO TABS
600.0000 mg | ORAL_TABLET | Freq: Four times a day (QID) | ORAL | Status: DC
  Administered 2014-02-10 – 2014-02-13 (×11): 600 mg via ORAL
  Filled 2014-02-10 (×11): qty 1

## 2014-02-10 MED ORDER — SIMETHICONE 80 MG PO CHEW
80.0000 mg | CHEWABLE_TABLET | Freq: Three times a day (TID) | ORAL | Status: DC
  Administered 2014-02-10 – 2014-02-13 (×8): 80 mg via ORAL
  Filled 2014-02-10 (×8): qty 1

## 2014-02-10 MED ORDER — WITCH HAZEL-GLYCERIN EX PADS: 1.0000 "application " | MEDICATED_PAD | CUTANEOUS | Status: DC | PRN

## 2014-02-10 MED ORDER — ACETAMINOPHEN 160 MG/5ML PO SOLN: 960.0000 mg | Freq: Four times a day (QID) | ORAL | Status: DC | PRN

## 2014-02-10 MED ORDER — ONDANSETRON HCL 4 MG/2ML IJ SOLN: 4.0000 mg | Freq: Three times a day (TID) | INTRAMUSCULAR | Status: DC | PRN

## 2014-02-10 MED ORDER — PHENYLEPHRINE HCL 10 MG/ML IJ SOLN
INTRAMUSCULAR | Status: AC
  Filled 2014-02-10: qty 1

## 2014-02-10 MED ORDER — LACTATED RINGERS IV SOLN
INTRAVENOUS | Status: DC | PRN
  Administered 2014-02-10 (×3): via INTRAVENOUS

## 2014-02-10 MED ORDER — OXYTOCIN 40 UNITS IN LACTATED RINGERS INFUSION - SIMPLE MED: 62.5000 mL/h | INTRAVENOUS | Status: AC

## 2014-02-10 MED ORDER — MEPERIDINE HCL 25 MG/ML IJ SOLN
INTRAMUSCULAR | Status: DC | PRN
  Administered 2014-02-10: 12.5 mg via INTRAVENOUS

## 2014-02-10 MED ORDER — DEXTROSE IN LACTATED RINGERS 5 % IV SOLN: INTRAVENOUS | Status: DC

## 2014-02-10 MED ORDER — SCOPOLAMINE 1 MG/3DAYS TD PT72
1.0000 | MEDICATED_PATCH | Freq: Once | TRANSDERMAL | Status: DC
  Administered 2014-02-10: 1.5 mg via TRANSDERMAL

## 2014-02-10 MED ORDER — OXYTOCIN 10 UNIT/ML IJ SOLN
INTRAMUSCULAR | Status: AC
  Filled 2014-02-10: qty 4

## 2014-02-10 MED ORDER — MORPHINE SULFATE 0.5 MG/ML IJ SOLN
INTRAMUSCULAR | Status: AC
  Filled 2014-02-10: qty 10

## 2014-02-10 MED ORDER — SIMETHICONE 80 MG PO CHEW: 80.0000 mg | CHEWABLE_TABLET | ORAL | Status: DC | PRN

## 2014-02-10 MED ORDER — NALBUPHINE HCL 10 MG/ML IJ SOLN: 5.0000 mg | Freq: Once | INTRAMUSCULAR | Status: AC | PRN

## 2014-02-10 MED ORDER — PRENATAL MULTIVITAMIN CH
1.0000 | ORAL_TABLET | Freq: Every day | ORAL | Status: DC
  Administered 2014-02-11 – 2014-02-12 (×2): 1 via ORAL
  Filled 2014-02-10 (×2): qty 1

## 2014-02-10 MED ORDER — SENNOSIDES-DOCUSATE SODIUM 8.6-50 MG PO TABS
2.0000 | ORAL_TABLET | ORAL | Status: DC
  Administered 2014-02-11 – 2014-02-12 (×3): 2 via ORAL
  Filled 2014-02-10 (×3): qty 2

## 2014-02-10 MED ORDER — DIBUCAINE 1 % RE OINT: 1.0000 "application " | TOPICAL_OINTMENT | RECTAL | Status: DC | PRN

## 2014-02-10 MED ORDER — MENTHOL 3 MG MT LOZG: 1.0000 | LOZENGE | OROMUCOSAL | Status: DC | PRN

## 2014-02-10 MED ORDER — OXYCODONE-ACETAMINOPHEN 5-325 MG PO TABS: 2.0000 | ORAL_TABLET | ORAL | Status: DC | PRN

## 2014-02-10 MED ORDER — OXYTOCIN 40 UNITS IN LACTATED RINGERS INFUSION - SIMPLE MED
INTRAVENOUS | Status: DC | PRN
  Administered 2014-02-10: 40 [IU] via INTRAVENOUS

## 2014-02-10 MED ORDER — DIPHENHYDRAMINE HCL 25 MG PO CAPS: 25.0000 mg | ORAL_CAPSULE | ORAL | Status: DC | PRN

## 2014-02-10 MED ORDER — NALOXONE HCL 0.4 MG/ML IJ SOLN: 0.4000 mg | INTRAMUSCULAR | Status: DC | PRN

## 2014-02-10 MED ORDER — FENTANYL CITRATE 0.05 MG/ML IJ SOLN
INTRAMUSCULAR | Status: DC | PRN
  Administered 2014-02-10: 25 ug via INTRATHECAL

## 2014-02-10 MED ORDER — ZOLPIDEM TARTRATE 5 MG PO TABS: 5.0000 mg | ORAL_TABLET | Freq: Every evening | ORAL | Status: DC | PRN

## 2014-02-10 MED ORDER — LACTATED RINGERS IV SOLN
INTRAVENOUS | Status: DC
  Administered 2014-02-10: 21:00:00 via INTRAVENOUS

## 2014-02-10 MED ORDER — FENTANYL CITRATE 0.05 MG/ML IJ SOLN
INTRAMUSCULAR | Status: AC
  Filled 2014-02-10: qty 2

## 2014-02-10 MED ORDER — ONDANSETRON HCL 4 MG/2ML IJ SOLN
INTRAMUSCULAR | Status: DC | PRN
  Administered 2014-02-10: 4 mg via INTRAVENOUS

## 2014-02-10 MED ORDER — MORPHINE SULFATE (PF) 0.5 MG/ML IJ SOLN
INTRAMUSCULAR | Status: DC | PRN
  Administered 2014-02-10: .1 mg via INTRATHECAL

## 2014-02-10 MED ORDER — KETOROLAC TROMETHAMINE 30 MG/ML IJ SOLN
30.0000 mg | Freq: Four times a day (QID) | INTRAMUSCULAR | Status: DC | PRN
  Administered 2014-02-10: 30 mg via INTRAMUSCULAR

## 2014-02-10 MED ORDER — SIMETHICONE 80 MG PO CHEW
80.0000 mg | CHEWABLE_TABLET | ORAL | Status: DC
  Administered 2014-02-11 – 2014-02-12 (×3): 80 mg via ORAL
  Filled 2014-02-10 (×3): qty 1

## 2014-02-10 MED ORDER — KETOROLAC TROMETHAMINE 30 MG/ML IJ SOLN: 30.0000 mg | Freq: Four times a day (QID) | INTRAMUSCULAR | Status: DC | PRN

## 2014-02-10 MED ORDER — SODIUM CHLORIDE 0.9 % IJ SOLN: 3.0000 mL | INTRAMUSCULAR | Status: DC | PRN

## 2014-02-10 MED ORDER — DIPHENHYDRAMINE HCL 50 MG/ML IJ SOLN
12.5000 mg | INTRAMUSCULAR | Status: DC | PRN
  Administered 2014-02-10: 12.5 mg via INTRAVENOUS
  Filled 2014-02-10: qty 1

## 2014-02-10 MED ORDER — KETOROLAC TROMETHAMINE 30 MG/ML IJ SOLN
INTRAMUSCULAR | Status: AC
  Filled 2014-02-10: qty 1

## 2014-02-10 MED ORDER — MEPERIDINE HCL 25 MG/ML IJ SOLN
INTRAMUSCULAR | Status: AC
  Filled 2014-02-10: qty 1

## 2014-02-10 MED ORDER — ONDANSETRON HCL 4 MG/2ML IJ SOLN: 4.0000 mg | INTRAMUSCULAR | Status: DC | PRN

## 2014-02-10 MED ORDER — SCOPOLAMINE 1 MG/3DAYS TD PT72
MEDICATED_PATCH | TRANSDERMAL | Status: AC
  Administered 2014-02-10: 1.5 mg via TRANSDERMAL
  Filled 2014-02-10: qty 1

## 2014-02-10 MED ORDER — DIPHENHYDRAMINE HCL 25 MG PO CAPS
25.0000 mg | ORAL_CAPSULE | Freq: Four times a day (QID) | ORAL | Status: DC | PRN
  Administered 2014-02-10: 25 mg via ORAL
  Filled 2014-02-10: qty 1

## 2014-02-10 MED ORDER — OXYCODONE-ACETAMINOPHEN 5-325 MG PO TABS
1.0000 | ORAL_TABLET | ORAL | Status: DC | PRN
  Administered 2014-02-11 – 2014-02-13 (×5): 1 via ORAL
  Filled 2014-02-10 (×5): qty 1

## 2014-02-10 MED ORDER — ONDANSETRON HCL 4 MG/2ML IJ SOLN
INTRAMUSCULAR | Status: AC
  Filled 2014-02-10: qty 2

## 2014-02-10 SURGICAL SUPPLY — 36 items
APL SKNCLS STERI-STRIP NONHPOA (GAUZE/BANDAGES/DRESSINGS) ×1
BENZOIN TINCTURE PRP APPL 2/3 (GAUZE/BANDAGES/DRESSINGS) ×2 IMPLANT
CLAMP CORD UMBIL (MISCELLANEOUS) ×2 IMPLANT
CLOSURE WOUND 1/2 X4 (GAUZE/BANDAGES/DRESSINGS) ×1
CLOTH BEACON ORANGE TIMEOUT ST (SAFETY) ×3 IMPLANT
DRAPE SHEET LG 3/4 BI-LAMINATE (DRAPES) ×2 IMPLANT
DRSG OPSITE POSTOP 4X10 (GAUZE/BANDAGES/DRESSINGS) ×3 IMPLANT
DURAPREP 26ML APPLICATOR (WOUND CARE) ×3 IMPLANT
ELECT REM PT RETURN 9FT ADLT (ELECTROSURGICAL) ×3
ELECTRODE REM PT RTRN 9FT ADLT (ELECTROSURGICAL) ×1 IMPLANT
EXTRACTOR VACUUM KIWI (MISCELLANEOUS) ×2 IMPLANT
EXTRACTOR VACUUM M CUP 4 TUBE (SUCTIONS) IMPLANT
EXTRACTOR VACUUM M CUP 4' TUBE (SUCTIONS)
GLOVE BIO SURGEON STRL SZ 6 (GLOVE) ×3 IMPLANT
GLOVE BIOGEL PI IND STRL 6 (GLOVE) ×2 IMPLANT
GLOVE BIOGEL PI INDICATOR 6 (GLOVE) ×4
GOWN STRL REUS W/TWL LRG LVL3 (GOWN DISPOSABLE) ×6 IMPLANT
KIT ABG SYR 3ML LUER SLIP (SYRINGE) ×3 IMPLANT
LIQUID BAND (GAUZE/BANDAGES/DRESSINGS) IMPLANT
NDL HYPO 25X5/8 SAFETYGLIDE (NEEDLE) ×1 IMPLANT
NEEDLE HYPO 25X5/8 SAFETYGLIDE (NEEDLE) ×3 IMPLANT
NS IRRIG 1000ML POUR BTL (IV SOLUTION) ×3 IMPLANT
PACK C SECTION WH (CUSTOM PROCEDURE TRAY) ×3 IMPLANT
PAD OB MATERNITY 4.3X12.25 (PERSONAL CARE ITEMS) ×3 IMPLANT
STAPLER VISISTAT 35W (STAPLE) IMPLANT
STRIP CLOSURE SKIN 1/2X4 (GAUZE/BANDAGES/DRESSINGS) ×1 IMPLANT
SUT CHROMIC 0 CTX 36 (SUTURE) ×9 IMPLANT
SUT MON AB 2-0 CT1 27 (SUTURE) ×3 IMPLANT
SUT PDS AB 0 CT1 27 (SUTURE) IMPLANT
SUT PLAIN 0 NONE (SUTURE) IMPLANT
SUT PLAIN 2 0 XLH (SUTURE) ×2 IMPLANT
SUT VIC AB 0 CT1 36 (SUTURE) IMPLANT
SUT VIC AB 4-0 KS 27 (SUTURE) ×2 IMPLANT
TOWEL OR 17X24 6PK STRL BLUE (TOWEL DISPOSABLE) ×3 IMPLANT
TRAY FOLEY CATH 14FR (SET/KITS/TRAYS/PACK) ×2 IMPLANT
WATER STERILE IRR 1000ML POUR (IV SOLUTION) ×3 IMPLANT

## 2014-02-10 NOTE — Addendum Note (Signed)
Addendum  created 02/10/14 1629 by Algis GreenhouseLinda A Zakaria Sedor, CRNA   Modules edited: Notes Section   Notes Section:  File: [id number]

## 2014-02-10 NOTE — Lactation Note (Signed)
This note was copied from the [facility] Lactation Consultation Note  Parents called because baby has been sleepy but has breastfed 6 times in 14 hours. Reviewed size of baby's stomach.  Provided mother with a hand pump and shells to help evert nipples. Mother can express good drops of colostrum. Assisted in latching baby in football hold after undressing him.  With assistance he latched and would suck, a few swallows. Reviewed chart in Baby & Me book.  Encouraged lots of STS and reassured mother.  Patient Name: Sharin MonsBoy Addis Daloia ZOXWR'UToday's Date: 02/10/2014 Reason for consult: Follow-up assessment   Maternal Data    Feeding Feeding Type: Breast Fed Length of feed: 10 min (off and on)  LATCH Score/Interventions Latch: Repeated attempts needed to sustain latch, nipple held in mouth throughout feeding, stimulation needed to elicit sucking reflex. Intervention(s): Adjust position;Assist with latch;Breast massage;Breast compression  Audible Swallowing: A few with stimulation Intervention(s): Skin to skin;Hand expression  Type of Nipple: Everted at rest and after stimulation  Comfort (Breast/Nipple): Soft / non-tender     Hold (Positioning): Assistance needed to correctly position infant at breast and maintain latch.  LATCH Score: 7  Lactation Tools Discussed/Used     Consult Status Consult Status: Follow-up Date: 02/11/14 Follow-up type: In-patient    Dahlia ByesBerkelhammer, [facility] 02/10/2014, 11:36 PM

## 2014-02-10 NOTE — Op Note (Signed)
Megan Barton PROCEDURE DATE: 02/10/2014  PREOPERATIVE DIAGNOSIS: Intrauterine pregnancy at  949w1d weeks gestation with suspected fetal macrosomia  POSTOPERATIVE DIAGNOSIS: The same  PROCEDURE:   Primary Low Transverse Cesarean Section  SURGEON:  Dr. Mitchel HonourMegan Michel Barton  INDICATIONS: Megan Barton is a 23 y.o. G1P0000 at 4749w1d scheduled for cesarean section secondary to suspected fetal macrosomia.  The risks of cesarean section discussed with the patient included but were not limited to: bleeding which may require transfusion or reoperation; infection which may require antibiotics; injury to bowel, bladder, ureters or other surrounding organs; injury to the fetus; need for additional procedures including hysterectomy in the event of a life-threatening hemorrhage; placental abnormalities wth subsequent pregnancies, incisional problems, thromboembolic phenomenon and other postoperative/anesthesia complications. The patient concurred with the proposed plan, giving informed written consent for the procedure.    FINDINGS:  Viable female infant in cephalic presentation, APGARs 8,9:  Weight 9#6  Copious clear amniotic fluid.  Intact placenta, three vessel cord.  Grossly normal uterus, ovaries and fallopian tubes. .   ANESTHESIA:  Spinal ESTIMATED BLOOD LOSS: 600 ml SPECIMENS: Placenta sent to L&D COMPLICATIONS: None immediate  PROCEDURE IN DETAIL:  The patient received intravenous antibiotics and had sequential compression devices applied to her lower extremities while in the preoperative area.  She was then taken to the operating room where spinal anesthesia was administered and was found to be adequate. She was then placed in a dorsal supine position with a leftward tilt, and prepped and draped in a sterile manner.  A foley catheter was placed into her bladder and attached to constant gravity.  After an adequate timeout was performed, a Pfannenstiel skin incision was made with scalpel and carried through to  the underlying layer of fascia. The fascia was incised in the midline and this incision was extended bilaterally using the Mayo scissors. Kocher clamps were applied to the superior aspect of the fascial incision and the underlying rectus muscles were dissected off bluntly. A similar process was carried out on the inferior aspect of the facial incision. The rectus muscles were separated in the midline bluntly and the peritoneum was entered bluntly.  A bladder flap was created sharply and developed bluntly.  Bladder blade was placed.  A transverse hysterotomy was made with a scalpel and extended bilaterally bluntly. The bladder blade was then removed. The infant was successfully delivered with a single Kiwi vacuum pull, and cord was clamped and cut and infant was handed over to awaiting neonatology team. Uterine massage was then administered and the placenta delivered intact with three-vessel cord. The uterus was cleared of clot and debris.  The hysterotomy was closed with 0 Chromic.  A second imbricating suture of 0 Chromic was used to reinforce the incision and aid in hemostasis.  The peritoneum and rectus muscles were noted to be hemostatic and were reapproximated using 3-0 monocryl in a running fashion.  The fascia was closed with 0-Vicryl in a running fashion with good restoration of anatomy.  The subcutaneus tissue was copiously irrigated and the >2cm dead space was closed using plain gut in a running fashion.  The skin was closed with 4-0 Vicryl in a subcuticular fashion.  Pt tolerated the procedure will.  All counts were correct x2.  Pt went to the recovery room in stable condition.

## 2014-02-10 NOTE — Anesthesia Procedure Notes (Signed)
Spinal Patient location during procedure: OR Preanesthetic Checklist Completed: patient identified, site marked, surgical consent, pre-op evaluation, timeout performed, IV checked, risks and benefits discussed and monitors and equipment checked Spinal Block Patient position: sitting Prep: DuraPrep Patient monitoring: heart rate, cardiac monitor, continuous pulse ox and blood pressure Approach: midline Location: L3-4 Injection technique: single-shot Needle Needle type: Sprotte  Needle gauge: 24 G Needle length: 9 cm Assessment Sensory level: T4 Additional Notes Spinal Dosage in OR  Bupivicaine ml       1.9 PFMS04   mcg        100 Fentanyl mcg            25    

## 2014-02-10 NOTE — Anesthesia Preprocedure Evaluation (Signed)
Anesthesia Evaluation  Patient identified by MRN, date of birth, ID band Patient awake    Reviewed: Allergy & Precautions, H&P , NPO status , Patient's Chart, lab work & pertinent test results  Airway Mallampati: II  TM Distance: >3 FB Neck ROM: full    Dental no notable dental hx.    Pulmonary neg pulmonary ROS,  breath sounds clear to auscultation  Pulmonary exam normal       Cardiovascular negative cardio ROS      Neuro/Psych negative neurological ROS  negative psych ROS   GI/Hepatic negative GI ROS, Neg liver ROS,   Endo/Other  negative endocrine ROS  Renal/GU negative Renal ROS     Musculoskeletal   Abdominal Normal abdominal exam  (+)   Peds  Hematology   Anesthesia Other Findings   Reproductive/Obstetrics (+) Pregnancy                             Anesthesia Physical Anesthesia Plan  ASA: III  Anesthesia Plan: Spinal   Post-op Pain Management:    Induction:   Airway Management Planned:   Additional Equipment:   Intra-op Plan:   Post-operative Plan:   Informed Consent: I have reviewed the patients History and Physical, chart, labs and discussed the procedure including the risks, benefits and alternatives for the proposed anesthesia with the patient or authorized representative who has indicated his/her understanding and acceptance.     Plan Discussed with: CRNA, Anesthesiologist and Surgeon  Anesthesia Plan Comments:         Anesthesia Quick Evaluation

## 2014-02-10 NOTE — Progress Notes (Signed)
No change to H&P. 

## 2014-02-10 NOTE — Plan of Care (Signed)
Problem: Consults Goal: Postpartum Patient Education (See Patient Education module for education specifics.)  Outcome: Progressing  Problem: Phase I Progression Outcomes Goal: Foley catheter patent Outcome: Completed/Met Date Met:  02/10/14 Goal: OOB as tolerated unless otherwise ordered Outcome: Completed/Met Date Met:  02/10/14 Goal: IS, TCDB as ordered Outcome: Completed/Met Date Met:  02/10/14 Goal: Initial discharge plan identified Outcome: Completed/Met Date Met:  02/10/14

## 2014-02-10 NOTE — Anesthesia Postprocedure Evaluation (Signed)
  Anesthesia Post-op Note  Patient: Megan Barton  Procedure(s) Performed: Procedure(s) with comments: CESAREAN SECTION (N/A) - Primary edc 02/09/14  Patient is awake, responsive, moving her legs, and has signs of resolution of her numbness. Pain and nausea are reasonably well controlled. Vital signs are stable and clinically acceptable. Oxygen saturation is clinically acceptable. There are no apparent anesthetic complications at this time. Patient is ready for discharge.

## 2014-02-10 NOTE — Anesthesia Postprocedure Evaluation (Signed)
Anesthesia Post Note  Patient: Megan Barton  Procedure(s) Performed: Procedure(s) (LRB): CESAREAN SECTION (N/A)  Anesthesia type: Spinal  Patient location: Mother/Baby  Post pain: Pain level controlled  Post assessment: Post-op Vital signs reviewed  Last Vitals:  Filed Vitals:   02/10/14 1400  BP: 102/35  Pulse: 65  Temp: 37.2 C  Resp: 18    Post vital signs: Reviewed  Level of consciousness: awake  Complications: No apparent anesthesia complications

## 2014-02-10 NOTE — Lactation Note (Signed)
This note was copied from the chart of [facility] Lactation Consultation Note  P1, Baby sleepy STS on mother. Reviewed hand expression. Small drop expressed. Discussed cluster feeding, how to achieve a deep latch. Mom made aware of O/P services, breastfeeding support groups, community resources, and our phone # for post-discharge questions.    Patient Name: Megan Barton Megan ArMacey Zhan Today's Date: 02/10/2014 Reason for consult: Initial assessment   Maternal Data Has patient been taught Hand Expression?: Yes Does the patient have breastfeeding experience prior to this delivery?: No  Feeding    LATCH Score/Interventions                      Lactation Tools Discussed/Used     Consult Status Consult Status: Follow-up Date: 02/11/14 Follow-up type: In-patient    Dahlia ByesBerkelhammer, Juanita Streight [facility] 02/10/2014, 7:27 PM

## 2014-02-10 NOTE — Consult Note (Signed)
Neonatology Note:   Attendance at C-section:    I was asked by Dr. Langston MaskerMorris to attend this primary C/S at term due to macrosomia. The mother is a G1P0 B pos, GBS neg with an uncomplicated pregnancy. ROM at delivery, fluid clear. Vacuum-assisted delivery. Infant vigorous with good spontaneous cry and tone. Needed bulb suctioning several times due to copious oral and nasal secretions. We did DeLee suctioning at 5 minutes due to continued secretions, and we retrieved 11 ml of clear fluid. Ap 8/9. Lungs clear to ausc in DR. To CN to care of Pediatrician.   Doretha Souhristie C. Nichoals Heyde, MD

## 2014-02-10 NOTE — Transfer of Care (Signed)
Immediate Anesthesia Transfer of Care Note  Patient: Megan Barton  Procedure(s) Performed: Procedure(s) with comments: CESAREAN SECTION (N/A) - Primary edc 02/09/14  Patient Location: PACU  Anesthesia Type:Spinal  Level of Consciousness: awake, alert , oriented and patient cooperative  Airway & Oxygen Therapy: Patient Spontanous Breathing  Post-op Assessment: Report given to PACU RN and Post -op Vital signs reviewed and stable  Post vital signs: Reviewed and stable  Complications: No apparent anesthesia complications

## 2014-02-11 LAB — BIRTH TISSUE RECOVERY COLLECTION (PLACENTA DONATION)

## 2014-02-11 LAB — CBC
HCT: 29 % — ABNORMAL LOW (ref 36.0–46.0)
HEMOGLOBIN: 9.4 g/dL — AB (ref 12.0–15.0)
MCH: 27 pg (ref 26.0–34.0)
MCHC: 32.4 g/dL (ref 30.0–36.0)
MCV: 83.3 fL (ref 78.0–100.0)
PLATELETS: 217 10*3/uL (ref 150–400)
RBC: 3.48 MIL/uL — ABNORMAL LOW (ref 3.87–5.11)
RDW: 14.8 % (ref 11.5–15.5)
WBC: 9.8 10*3/uL (ref 4.0–10.5)

## 2014-02-11 NOTE — Lactation Note (Signed)
This note was copied from the chart of [facility]. Lactation Consultation Note Follow up visit at 38 hours of age.  FOB requests formula so I offered a visit.  Baby is STS in reclined position latched to breast, but pulls off crying some.  Mom reports baby eating for about 2 hours cluster feeding, and baby has been gassy and fussy.  Small drops of colostrum expressed and finger fed to baby.  Attempted gloved finger assessment with baby who has a strong rhythmic suck, but does not maintain well possible indicating he is no longer hungry at this time.  Family reports a lot of visitors today.  Discussed baby may be having over stimulation and also discussed normal cluster feeding.  Baby screams but when returned to the breast does not appear eager to breastfeed.  He is rigid and tense, not relaxed even when STS on moms chest.  Baby has had 10 feedings with 4voids and 2 stools in the past 24 hours.  Last stool >12 ago.  Latch scores of 7-8.  Discussed how formula supplementation can affect breast feeding in detail.  Encouraged parents to wait for weight check tonight and try to comfort baby in other ways.  Discussed if baby ha significant increased weight loss that may warrant supplementation.  Encouraged mom to continue hand expression and offer EBM PRN.  Parents agree this is a good plan and also suggest baby had a lot of visitors today and was passed around a lot.  Before completing this documentation MBU RN reports FOB is asking for formula for baby.  Mom may want to consider pumping.  Lc to follow prior to discharge or as needed.    Patient Name: Megan Barton FAOZH'YToday's Date: 02/11/2014 Reason for consult: Follow-up assessment   Maternal Data Has patient been taught Hand Expression?: Yes Does the patient have breastfeeding experience prior to this delivery?: No  Feeding Feeding Type: Breast Fed Length of feed:  (few minutes)  LATCH Score/Interventions Latch: Grasps breast easily, tongue down,  lips flanged, rhythmical sucking.  Audible Swallowing: A few with stimulation Intervention(s): Skin to skin;Hand expression  Type of Nipple: Everted at rest and after stimulation  Comfort (Breast/Nipple): Soft / non-tender     Hold (Positioning): Assistance needed to correctly position infant at breast and maintain latch. Intervention(s): Skin to skin;Position options;Support Pillows;Breastfeeding basics reviewed  LATCH Score: 8  Lactation Tools Discussed/Used Tools: Pump Breast pump type: Manual   Consult Status Consult Status: Follow-up Date: 02/12/14 Follow-up type: In-patient    Jannifer RodneyShoptaw, Andee Chivers Lynn 02/11/2014, 10:29 PM

## 2014-02-11 NOTE — Lactation Note (Signed)
This [facility] Lactation Consultation Note  Patient Name: Megan MonsBoy Deaisa Barton ZOXWR'UToday's Date: 02/11/2014 Reason for consult: Follow-up assessment Baby 30 hours of life. Mom's [facility] RN Efraim KaufmannMelissa reports mom has had nipple soreness, especially right breast. Mom's right breast is bruised just above right nipple. Mom able to hand express colostrum. Assisted mom to position self in bed with pillows behind back in order to provide enough room for baby's legs.  Baby's mouth in better alignment with mom's breast.  Enc mom to compress right breast with baby in football position and mom able to achieve a deeper latch.  Baby latches deeply, suckles rhythmically, with some swallows noted. Demonstrated to mom how to tug baby's chin to flange lower lip outward. Mom reports no pain now. Enc mom to maintain a deep latch and if baby slips to tip of nipple to re-latch baby more deeply. Enc mom to ask for assistance with latching as needed. Discussed cluster-feeding with parents.   Maternal Data    Feeding Feeding Type: Breast Fed Length of feed: 30 min  LATCH Score/Interventions Latch: Grasps breast easily, tongue down, lips flanged, rhythmical sucking. Intervention(s): Adjust position;Assist with latch;Breast compression  Audible Swallowing: A few with stimulation  Type of Nipple: Everted at rest and after stimulation (semi flat, everts with stimulation.)  Comfort (Breast/Nipple): Soft / non-tender     Hold (Positioning): Assistance needed to correctly position infant at breast and maintain latch.  LATCH Score: 8  Lactation Tools Discussed/Used     Consult Status Consult Status: Follow-up Date: 02/12/14 Follow-up type: In-patient    Geralynn OchsWILLIARD, Sargon Scouten 02/11/2014, 2:01 PM

## 2014-02-11 NOTE — Progress Notes (Signed)
Subjective: Postpartum Day 1: Cesarean Delivery Patient reports tolerating PO and no problems voiding.    Objective: Vital signs in last 24 hours: Temp:  [97.5 F (36.4 C)-99.2 F (37.3 C)] 98.9 F (37.2 C) (11/06 0451) Pulse Rate:  [63-97] 85 (11/06 0451) Resp:  [13-22] 20 (11/06 0451) BP: (97-126)/(35-69) 108/61 mmHg (11/06 0451) SpO2:  [93 %-100 %] 95 % (11/06 0451)  Physical Exam:  General: alert and cooperative Lochia: appropriate Uterine Fundus: firm Incision: healing well DVT Evaluation: No evidence of DVT seen on physical exam. Negative Homan's sign. No cords or calf tenderness. No significant calf/ankle edema.   Recent Labs  02/08/14 1335 02/11/14 0557  HGB 11.1* 9.4*  HCT 35.0* 29.0*    Assessment/Plan: Status post Cesarean section. Doing well postoperatively.  Hold on circ for urological evaluation to r/o hypospadius.  Gatha Mcnulty G 02/11/2014, 8:01 AM

## 2014-02-11 NOTE — Plan of Care (Signed)
Problem: Phase I Progression Outcomes Goal: Pain controlled with appropriate interventions Outcome: Completed/Met Date Met:  02/11/14 Goal: VS, stable, temp < 100.4 degrees F Outcome: Completed/Met Date Met:  02/11/14  Problem: Phase II Progression Outcomes Goal: Tolerating diet Outcome: Completed/Met Date Met:  02/11/14

## 2014-02-11 NOTE — Plan of Care (Signed)
Problem: Phase II Progression Outcomes Goal: Other Phase II Outcomes/Goals Outcome: Completed/Met Date Met:  02/11/14  Problem: Discharge Progression Outcomes Goal: Tolerating diet Outcome: Completed/Met Date Met:  02/11/14 Goal: Pain controlled with appropriate interventions Outcome: Completed/Met Date Met:  02/11/14 Goal: Discharge plan in place and appropriate Outcome: Completed/Met Date Met:  02/11/14

## 2014-02-11 NOTE — Plan of Care (Signed)
Problem: Phase I Progression Outcomes Goal: Voiding adequately Outcome: Completed/Met Date Met:  02/11/14 Goal: Other Phase I Outcomes/Goals Outcome: Not Applicable Date Met:  81/02/54  Problem: Phase II Progression Outcomes Goal: Pain controlled on oral analgesia Outcome: Completed/Met Date Met:  02/11/14 Goal: Progress activity as tolerated unless otherwise ordered Outcome: Completed/Met Date Met:  02/11/14 Goal: Afebrile, VS remain stable Outcome: Completed/Met Date Met:  02/11/14 Goal: Incision intact & without signs/symptoms of infection Outcome: Completed/Met Date Met:  02/11/14

## 2014-02-12 ENCOUNTER — Encounter (HOSPITAL_COMMUNITY): Payer: Self-pay | Admitting: Obstetrics & Gynecology

## 2014-02-12 LAB — TYPE AND SCREEN
ABO/RH(D): B POS
ANTIBODY SCREEN: NEGATIVE
UNIT DIVISION: 0
Unit division: 0

## 2014-02-12 NOTE — Lactation Note (Signed)
This note was copied from the [facility]. Lactation Consultation Note: Follow up visit with mom before DC. She has been giving bottles through the night. When I went into room, Mom states that visitors just got here and now is not a good time for visit. Encouraged to page for assist when she is ready.   Patient Name: Sharin MonsBoy Cicilia Weseman ZOXWR'UToday's Date: 02/12/2014 Reason for consult: Follow-up assessment   Maternal Data Formula Feeding for Exclusion: No Does the patient have breastfeeding experience prior to this delivery?: No  Feeding   LATCH Score/Interventions                      Lactation Tools Discussed/Used Tools: Bottle   Consult Status Consult Status: PRN    Pamelia HoitWeeks, Shanece Cochrane D 02/12/2014, 9:43 AM

## 2014-02-12 NOTE — Plan of Care (Signed)
Problem: Discharge Progression Outcomes Goal: Barriers To Progression Addressed/Resolved Outcome: Completed/Met Date Met:  02/12/14 Goal: Activity appropriate for discharge plan Outcome: Completed/Met Date Met:  02/33/43 Goal: Complications resolved/controlled Outcome: Completed/Met Date Met:  02/12/14 Goal: Afebrile, VS remain stable at discharge Outcome: Completed/Met Date Met:  02/12/14 Goal: Other Discharge Outcomes/Goals Outcome: Completed/Met Date Met:  02/12/14

## 2014-02-12 NOTE — Progress Notes (Signed)
Subjective: Postpartum Day 2: Cesarean Delivery Patient reports tolerating PO, + flatus and no problems voiding.    Objective: Vital signs in last 24 hours: Temp:  [97.9 F (36.6 C)-98.5 F (36.9 C)] 97.9 F (36.6 C) (11/07 0626) Pulse Rate:  [97-103] 97 (11/07 0626) Resp:  [18-20] 18 (11/07 0626) BP: (115-131)/(59-67) 115/59 mmHg (11/07 0626) SpO2:  [97 %-98 %] 98 % (11/07 0626)  Physical Exam:  General: alert, cooperative, appears stated age and no distress Lochia: appropriate Uterine Fundus: firm Incision: healing well DVT Evaluation: No evidence of DVT seen on physical exam.   Recent Labs  02/11/14 0557  HGB 9.4*  HCT 29.0*    Assessment/Plan: Status post Cesarean section. Doing well postoperatively.  Continue current care.  Tanairy Payeur C 02/12/2014, 10:08 AM

## 2014-02-13 ENCOUNTER — Ambulatory Visit: Payer: Self-pay

## 2014-02-13 MED ORDER — IBUPROFEN 600 MG PO TABS: 600.0000 mg | ORAL_TABLET | Freq: Four times a day (QID) | ORAL | Status: DC

## 2014-02-13 MED ORDER — OXYCODONE-ACETAMINOPHEN 5-325 MG PO TABS: 1.0000 | ORAL_TABLET | ORAL | Status: DC | PRN

## 2014-02-13 NOTE — Plan of Care (Signed)
Problem: Consults Goal: Postpartum Patient Education (See Patient Education module for education specifics.)  Outcome: Completed/Met Date Met:  02/13/14     

## 2014-02-13 NOTE — Discharge Summary (Signed)
Obstetric Discharge Summary Reason for Admission: cesarean section Prenatal Procedures: none Intrapartum Procedures: cesarean: low cervical, transverse Postpartum Procedures: none Complications-Operative and Postpartum: none HEMOGLOBIN  Date Value Ref Range Status  02/11/2014 9.4* 12.0 - 15.0 g/dL Final   HCT  Date Value Ref Range Status  02/11/2014 29.0* 36.0 - 46.0 % Final    Physical Exam:  General: alert, cooperative, appears stated age and no distress Lochia: appropriate Uterine Fundus: firm Incision: healing well DVT Evaluation: No evidence of DVT seen on physical exam.  Discharge Diagnoses: Term Pregnancy-delivered  Discharge Information: Date: 02/13/2014 Activity: pelvic rest Diet: routine Medications: Ibuprofen and Percocet Condition: stable Instructions: refer to practice specific booklet Discharge to: home   Newborn Data: Live born female  Birth Weight: 9 lb 6.3 oz (4261 g) APGAR: 8, 9  Home with mother.  Joram Venson C 02/13/2014, 9:49 AM

## 2014-02-13 NOTE — Lactation Note (Signed)
This [facility] Lactation Consultation Note  Patient Name: Megan Barton Today's Date: 02/13/2014  mom was given comfort gels by [facility] RN    Maternal Data    Feeding    LATCH Score/Interventions                      Lactation Tools Discussed/Used     Consult Status      Kathrin Greathouseorio, Nilza Eaker Ann 02/13/2014, 12:40 PM

## 2014-02-23 ENCOUNTER — Encounter (HOSPITAL_COMMUNITY): Payer: Self-pay | Admitting: *Deleted

## 2014-03-24 ENCOUNTER — Other Ambulatory Visit: Payer: Self-pay | Admitting: Obstetrics & Gynecology

## 2014-03-28 LAB — CYTOLOGY - PAP

## 2014-12-05 IMAGING — US US OB TRANSVAGINAL
1 series · 14 of 28 positions shown · non-contrast
Comparison: None.

CLINICAL DATA: Four weeks pregnant with spotting.

EXAM:
OBSTETRIC <14 WK US AND TRANSVAGINAL OB US
TECHNIQUE: Both transabdominal and transvaginal ultrasound examinations were
performed for complete evaluation of the gestation as well as the
maternal uterus, adnexal regions, and pelvic cul-de-sac.
Transvaginal technique was performed to assess early pregnancy.

[Series 1: us ob comp less 14 wks · 14 of 69 slices shown]
[im 3/69]
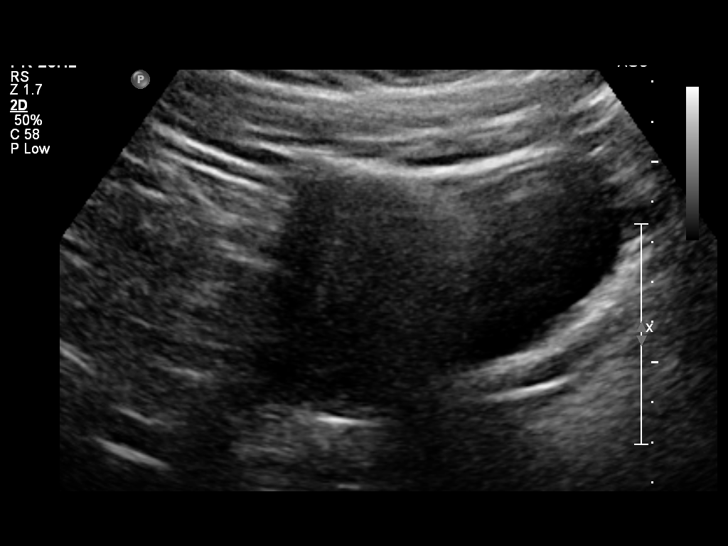
[im 8/69]
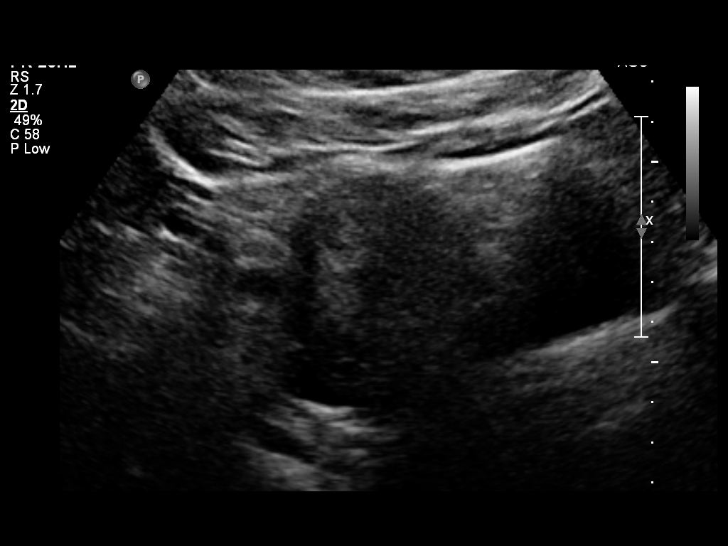
[im 13/69]
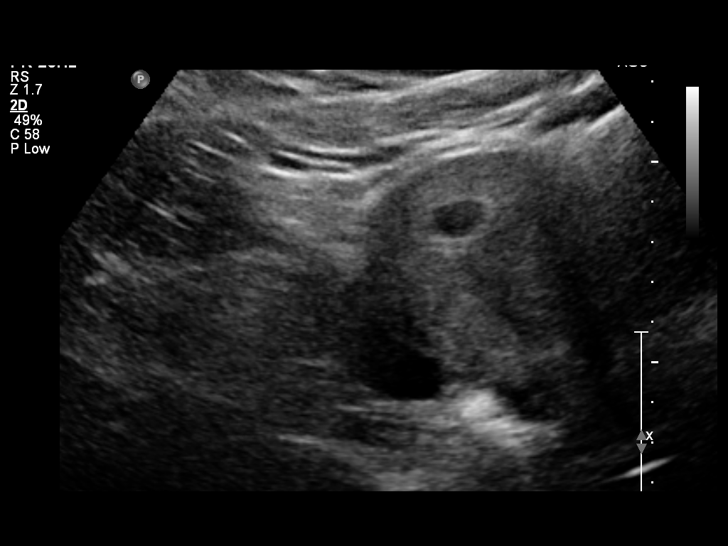
[im 18/69]
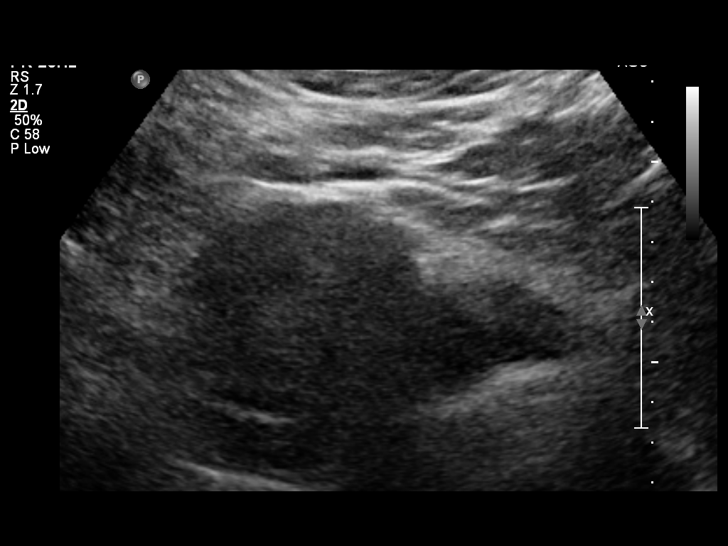
[im 23/69]
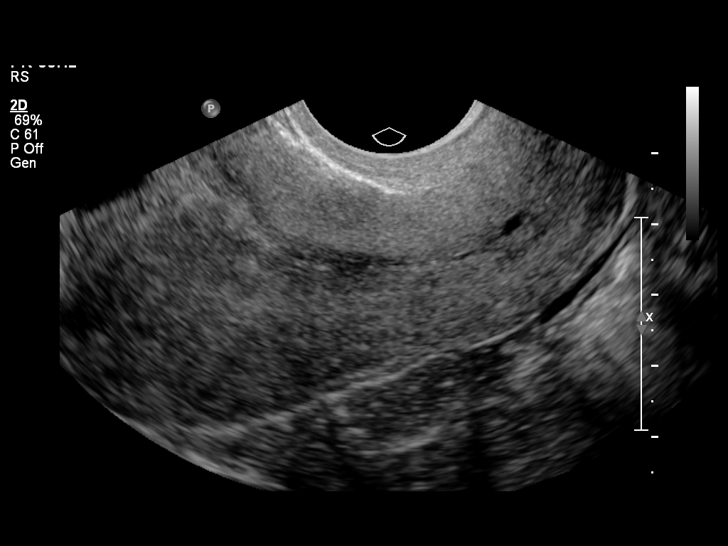
[im 28/69]
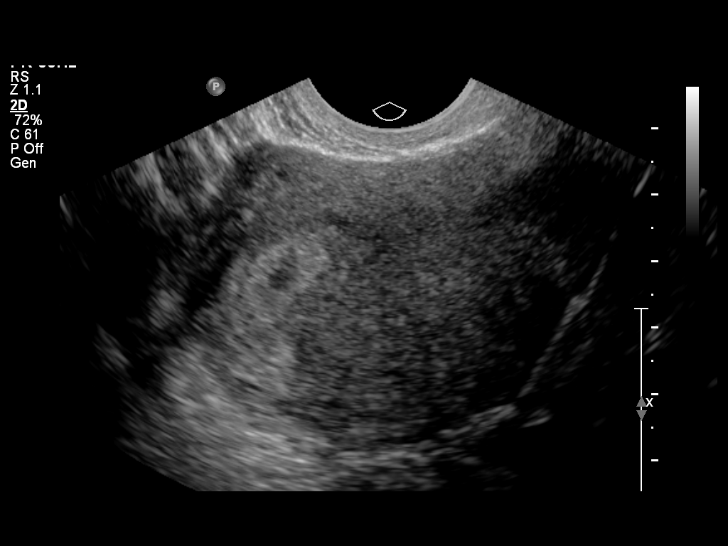
[im 33/69]
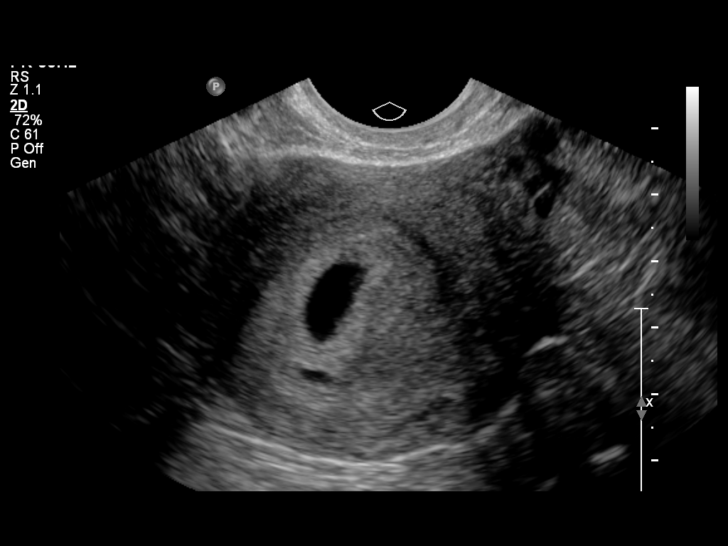
[im 38/69]
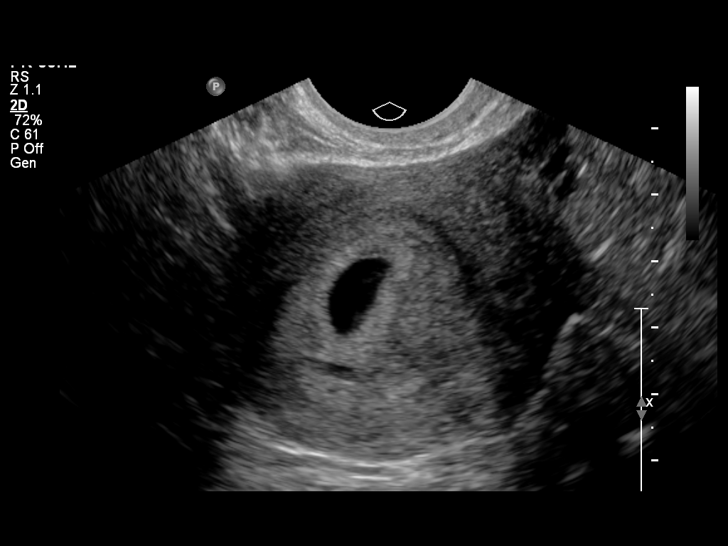
[im 43/69]
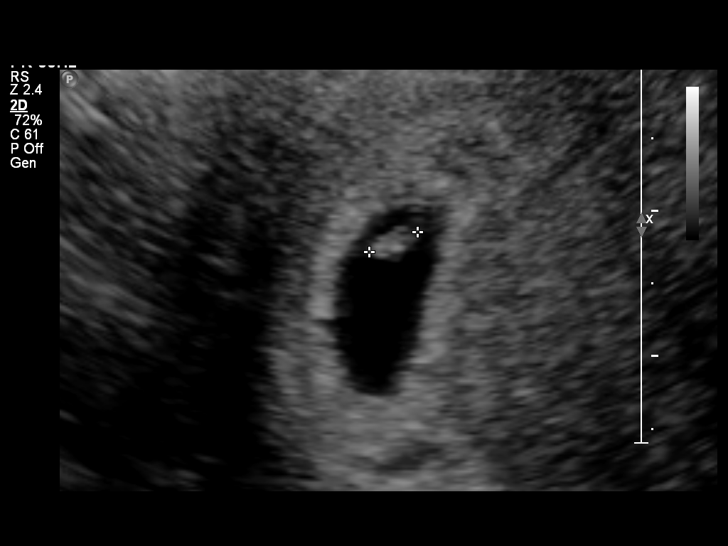
[im 48/69]
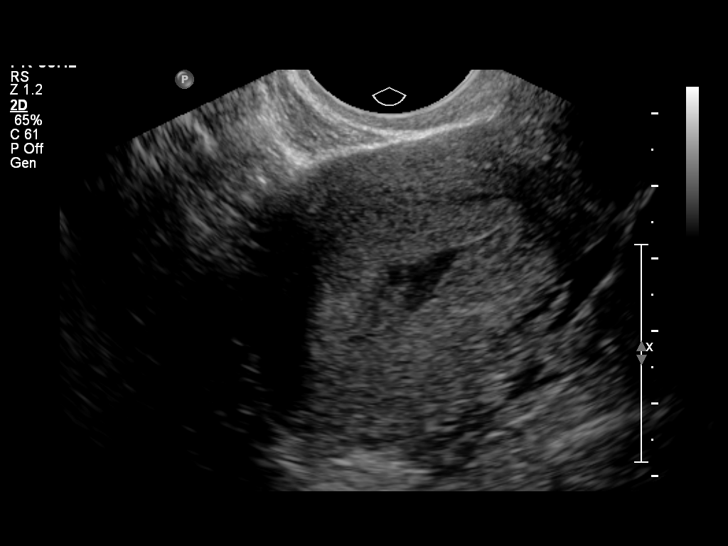
[im 53/69]
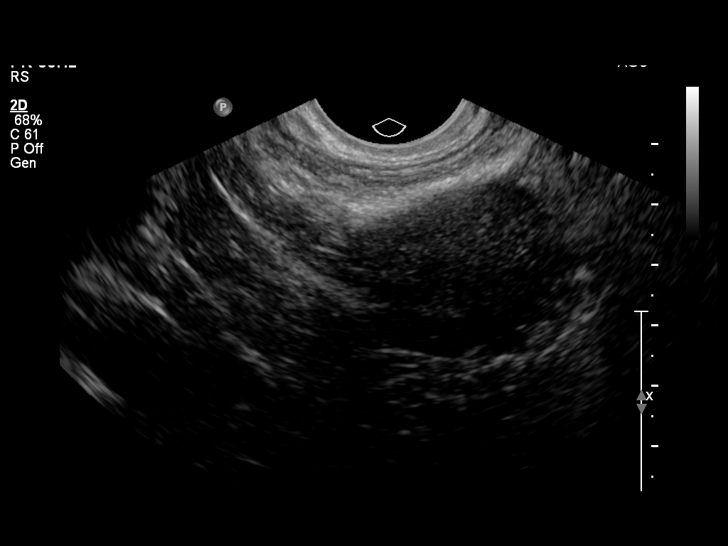
[im 58/69]
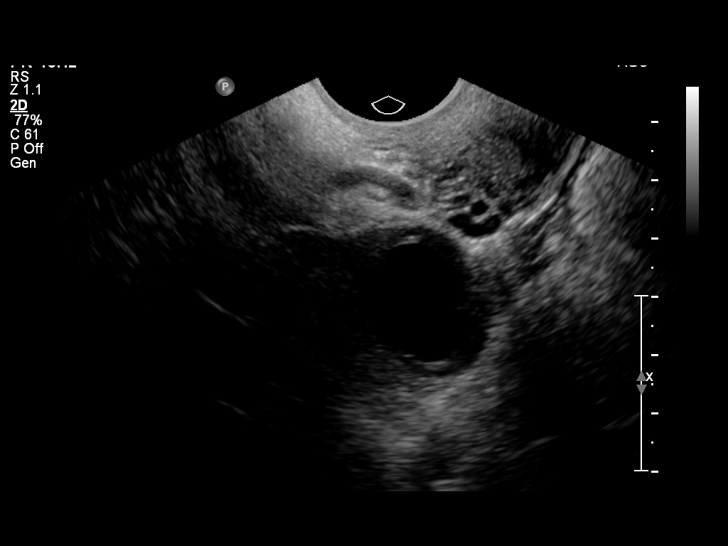
[im 63/69]
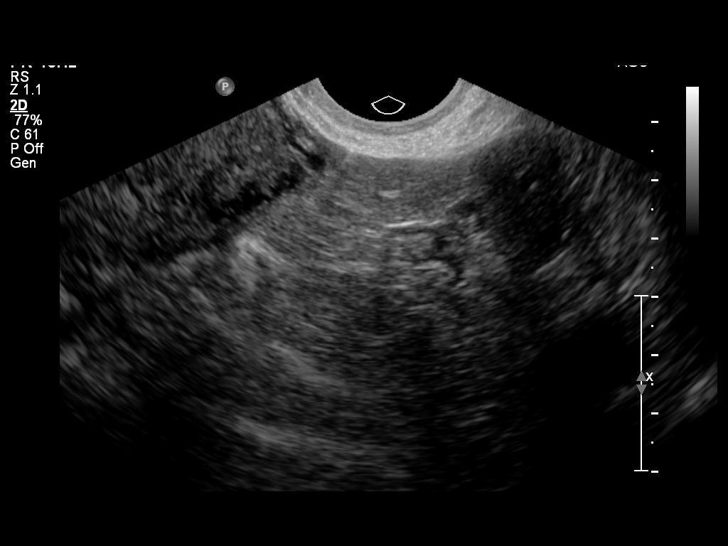
[im 69/69]
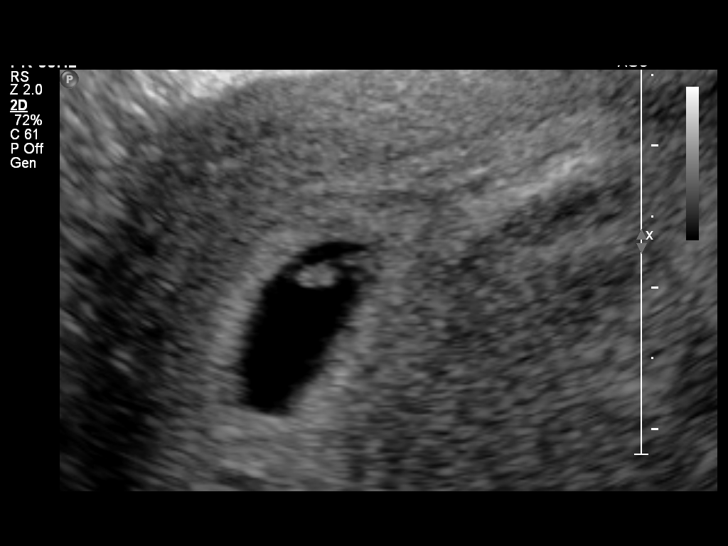

[14 of 28 positions shown; findings below may reference images not displayed]

FINDINGS: Intrauterine gestational sac: Visualized/normal in shape.

Yolk sac:  Present

Embryo:  Present

Cardiac Activity: Present

Heart Rate:  116 bpm

CRL:   3.6  mm   6 w 1d                  US EDC: 02/09/2014

Maternal uterus/adnexae: There is a small subchorionic hematoma, 1
cm in maximal length by up to 5 mm in thickness. No acute adnexal
findings. There is a 2 cm cystic structure in the right ovary which
is likely a follicular cyst. No adnexal mass or free pelvic fluid.
IMPRESSION: 1. Single, living intrauterine gestation - estimated age 6 weeks 1
day.
2. Small subchorionic hematoma.

## 2016-05-31 ENCOUNTER — Other Ambulatory Visit (HOSPITAL_COMMUNITY): Payer: Self-pay | Admitting: Obstetrics & Gynecology

## 2016-05-31 DIAGNOSIS — Z3141 Encounter for fertility testing: Secondary | ICD-10-CM

## 2016-06-07 ENCOUNTER — Ambulatory Visit (HOSPITAL_COMMUNITY)
Admission: RE | Admit: 2016-06-07 | Discharge: 2016-06-07 | Disposition: A | Discharge status: Home / Self Care | Payer: BLUE CROSS/BLUE SHIELD | Source: Ambulatory Visit | Attending: Obstetrics & Gynecology | Admitting: Obstetrics & Gynecology

## 2016-06-07 ENCOUNTER — Encounter (HOSPITAL_COMMUNITY): Payer: Self-pay | Admitting: Radiology

## 2016-06-07 DIAGNOSIS — Z3141 Encounter for fertility testing: Secondary | ICD-10-CM | POA: Diagnosis present

## 2016-06-07 MED ORDER — IOPAMIDOL (ISOVUE-300) INJECTION 61%
30.0000 mL | Freq: Once | INTRAVENOUS | Status: AC | PRN
  Administered 2016-06-07: 16 mL

## 2016-09-23 ENCOUNTER — Encounter (HOSPITAL_COMMUNITY): Payer: Self-pay | Admitting: Emergency Medicine

## 2016-09-23 ENCOUNTER — Emergency Department (HOSPITAL_COMMUNITY)
Admission: EM | Admit: 2016-09-23 | Discharge: 2016-09-23 | Disposition: A | Discharge status: Home / Self Care | Payer: BLUE CROSS/BLUE SHIELD | Attending: Emergency Medicine | Admitting: Emergency Medicine

## 2016-09-23 DIAGNOSIS — R1011 Right upper quadrant pain: Secondary | ICD-10-CM | POA: Diagnosis not present

## 2016-09-23 DIAGNOSIS — R1010 Upper abdominal pain, unspecified: Secondary | ICD-10-CM

## 2016-09-23 DIAGNOSIS — R109 Unspecified abdominal pain: Secondary | ICD-10-CM | POA: Diagnosis present

## 2016-09-23 LAB — CBC WITH DIFFERENTIAL/PLATELET
Basophils Absolute: 0 10*3/uL (ref 0.0–0.1)
Basophils Relative: 0 %
Eosinophils Absolute: 0.1 10*3/uL (ref 0.0–0.7)
Eosinophils Relative: 0 %
HEMATOCRIT: 40.8 % (ref 36.0–46.0)
HEMOGLOBIN: 13.1 g/dL (ref 12.0–15.0)
Lymphocytes Relative: 10 %
Lymphs Abs: 1.3 10*3/uL (ref 0.7–4.0)
MCH: 27.5 pg (ref 26.0–34.0)
MCHC: 32.1 g/dL (ref 30.0–36.0)
MCV: 85.5 fL (ref 78.0–100.0)
MONOS PCT: 5 %
Monocytes Absolute: 0.6 10*3/uL (ref 0.1–1.0)
NEUTROS ABS: 10.7 10*3/uL — AB (ref 1.7–7.7)
NEUTROS PCT: 85 %
Platelets: 309 10*3/uL (ref 150–400)
RBC: 4.77 MIL/uL (ref 3.87–5.11)
RDW: 13.8 % (ref 11.5–15.5)
WBC: 12.7 10*3/uL — ABNORMAL HIGH (ref 4.0–10.5)

## 2016-09-23 LAB — COMPREHENSIVE METABOLIC PANEL
ALK PHOS: 70 U/L (ref 38–126)
ALT: 13 U/L — AB (ref 14–54)
ANION GAP: 11 (ref 5–15)
AST: 20 U/L (ref 15–41)
Albumin: 4 g/dL (ref 3.5–5.0)
BUN: 10 mg/dL (ref 6–20)
CHLORIDE: 104 mmol/L (ref 101–111)
CO2: 24 mmol/L (ref 22–32)
CREATININE: 0.48 mg/dL (ref 0.44–1.00)
Calcium: 8.9 mg/dL (ref 8.9–10.3)
GFR calc non Af Amer: >60 mL/min (ref >60)
GLUCOSE: 104 mg/dL — AB (ref 65–99)
Potassium: 3.4 mmol/L — ABNORMAL LOW (ref 3.5–5.1)
SODIUM: 139 mmol/L (ref 135–145)
Total Bilirubin: 0.7 mg/dL (ref 0.3–1.2)
Total Protein: 7.6 g/dL (ref 6.5–8.1)

## 2016-09-23 LAB — URINALYSIS, ROUTINE W REFLEX MICROSCOPIC
Bilirubin Urine: NEGATIVE
GLUCOSE, UA: NEGATIVE mg/dL
Hgb urine dipstick: NEGATIVE
Ketones, ur: NEGATIVE mg/dL
LEUKOCYTES UA: NEGATIVE
NITRITE: NEGATIVE
PH: 6 (ref 5.0–8.0)
Protein, ur: NEGATIVE mg/dL
Specific Gravity, Urine: >1.03 — ABNORMAL HIGH (ref 1.005–1.030)

## 2016-09-23 LAB — PREGNANCY, URINE: Preg Test, Ur: NEGATIVE

## 2016-09-23 LAB — LIPASE, BLOOD: Lipase: 25 U/L (ref 11–51)

## 2016-09-23 MED ORDER — ONDANSETRON HCL 4 MG/2ML IJ SOLN
4.0000 mg | Freq: Once | INTRAMUSCULAR | Status: AC
  Administered 2016-09-23: 4 mg via INTRAVENOUS
  Filled 2016-09-23: qty 2

## 2016-09-23 MED ORDER — ONDANSETRON 4 MG PO TBDP: 4.0000 mg | ORAL_TABLET | Freq: Three times a day (TID) | ORAL | 0 refills | Status: DC | PRN

## 2016-09-23 MED ORDER — HYDROCODONE-ACETAMINOPHEN 5-325 MG PO TABS: 2.0000 | ORAL_TABLET | ORAL | 0 refills | Status: DC | PRN

## 2016-09-23 MED ORDER — FAMOTIDINE IN NACL 20-0.9 MG/50ML-% IV SOLN
20.0000 mg | Freq: Once | INTRAVENOUS | Status: AC
  Administered 2016-09-23: 20 mg via INTRAVENOUS
  Filled 2016-09-23: qty 50

## 2016-09-23 MED ORDER — SODIUM CHLORIDE 0.9 % IV BOLUS (SEPSIS)
1000.0000 mL | Freq: Once | INTRAVENOUS | Status: AC
  Administered 2016-09-23: 1000 mL via INTRAVENOUS

## 2016-09-23 NOTE — ED Provider Notes (Signed)
[facility]-EMER[facility]ENCY DEPT Provider Note   CSN: [id number] Arrival date & time: 09/23/16  1812     History   Chief Complaint Chief Complaint  Patient presents with  . Abdominal Pain    HPI Megan Barton is a 26 y.o. female.  The history is provided by the patient. No language interpreter was used.    Past Medical History:  Diagnosis Date  . Anemia   . Heartburn in pregnancy    zantac  . Obesity 01/06/2013  . Patient desires pregnancy 11/06/2012   resolved  . UTI (lower urinary tract infection) 01/06/2013   resolved    Patient Active Problem List   Diagnosis Date Noted  . S/P cesarean section 02/10/2014  . Obesity in pregnancy, antepartum 07/21/2013  . Supervision of normal first pregnancy 06/22/2013    Past Surgical History:  Procedure Laterality Date  . CESAREAN SECTION N/A 02/10/2014   Procedure: CESAREAN SECTION;  Surgeon: Mitchel HonourMegan Morris, DO;  Location: [facility];  Service: Obstetrics;  Laterality: N/A;  Primary edc 02/09/14  . WISDOM TOOTH EXTRACTION      OB History    Gravida Para Term Preterm AB Living   1 1 1  0 0 1   SAB TAB Ectopic Multiple Live Births   0 0 0 0 1       Home Medications    Prior to Admission medications   Medication Sig Start Date End Date Taking? Authorizing Provider  aluminum & magnesium hydroxide-simethicone (MYLANTA) 500-450-40 MG/5ML suspension Take 10 mLs by mouth as needed for indigestion.    [provider]  HYDROcodone-acetaminophen (NORCO/VICODIN) 5-325 MG tablet Take 2 tablets by mouth every 4 (four) hours as needed. 09/23/16   Elson AreasSofia, Leslie K, PA-C  ibuprofen (ADVIL,MOTRIN) 600 MG tablet Take 1 tablet (600 mg total) by mouth every 6 (six) hours. 02/13/14   Candice CampLowe, David, MD  ondansetron (ZOFRAN ODT) 4 MG disintegrating tablet Take 1 tablet (4 mg total) by mouth every 8 (eight) hours as needed for nausea or vomiting. 09/23/16   Elson AreasSofia, Leslie K, PA-C  oxyCODONE-acetaminophen (PERCOCET/ROXICET) 5-325 MG per tablet Take 1 tablet by  mouth every 4 (four) hours as needed (for pain scale less than 7). 02/13/14   Candice CampLowe, David, MD  Prenatal Vit-Fe Fumarate-FA (PRENATAL MULTIVITAMIN) TABS tablet Take 1 tablet by mouth daily at 12 noon.    [provider]  ranitidine (ZANTAC) 150 MG tablet Take 150 mg by mouth at bedtime as needed for heartburn.    [provider]    Family History Family History  Problem Relation Age of Onset  . Diabetes Mother   . Hypertension Father   . Asthma Brother   . Cancer Paternal Aunt        breast  . Cancer Paternal Grandfather        colon, liver    Social History Social History  Substance Use Topics  . Smoking status: Never Smoker  . Smokeless tobacco: Never Used  . Alcohol use No     Allergies   Keflex [cephalexin]   Review of Systems Review of Systems  All other systems reviewed and are negative.    Physical Exam Updated Vital Signs BP 138/83   Pulse 98   Temp 98.1 F (36.7 C) (Oral)   Resp 16   Ht 5\' 10"  (1.778 m)   Wt 120.2 kg (265 lb)   LMP 09/03/2016   SpO2 100%   BMI 38.02 kg/m   Physical Exam  Constitutional: She appears well-developed  and well-nourished.  HENT:  Head: Normocephalic.  Right Ear: External ear normal.  Left Ear: External ear normal.  Mouth/Throat: Oropharynx is clear and moist.  Eyes: Conjunctivae are normal. Pupils are equal, round, and reactive to light.  Neck: Normal range of motion.  Cardiovascular: Normal rate and regular rhythm.   Pulmonary/Chest: Effort normal and breath sounds normal.  Abdominal: Soft. There is tenderness.  Musculoskeletal: Normal range of motion.  Neurological: She is alert.  Skin: Skin is warm.  Psychiatric: She has a normal mood and affect.  Nursing note and vitals reviewed.    ED Treatments / Results  Labs (all labs ordered are listed, but only abnormal results are displayed) Labs Reviewed  COMPREHENSIVE METABOLIC PANEL - Abnormal; Notable for the following:       Result Value    Potassium 3.4 (*)    Glucose, Bld 104 (*)    ALT 13 (*)    All other components within normal limits  URINALYSIS, ROUTINE W REFLEX MICROSCOPIC - Abnormal; Notable for the following:    Specific Gravity, Urine >1.030 (*)    All other components within normal limits  CBC WITH DIFFERENTIAL/PLATELET - Abnormal; Notable for the following:    WBC 12.7 (*)    Neutro Abs 10.7 (*)    All other components within normal limits  LIPASE, BLOOD  PREGNANCY, URINE    EKG  EKG Interpretation None       Radiology No results found.  Procedures Procedures (including critical care time)  Medications Ordered in ED Medications  sodium chloride 0.9 % bolus 1,000 mL (0 mLs Intravenous Stopped 09/23/16 2140)  famotidine (PEPCID) IVPB 20 mg premix (0 mg Intravenous Stopped 09/23/16 2110)  ondansetron (ZOFRAN) injection 4 mg (4 mg Intravenous Given 09/23/16 2026)     Initial Impression / Assessment and Plan / ED Course  I have reviewed the triage vital signs and the nursing notes.  Pertinent labs & imaging results that were available during my care of the patient were reviewed by me and considered in my medical decision making (see chart for details).     Pt feels better after Iv pepcid and zofran.   I am concerned that pt had a gallbladder attack.  Labs do not indicate acute abnormality.   Final Clinical Impressions(s) / ED Diagnoses   Final diagnoses:  Right upper quadrant abdominal pain    New Prescriptions Discharge Medication List as of 09/23/2016  9:42 PM    START taking these medications   Details  HYDROcodone-acetaminophen (NORCO/VICODIN) 5-325 MG tablet Take 2 tablets by mouth every 4 (four) hours as needed., Starting Mon 09/23/2016, Print    ondansetron (ZOFRAN ODT) 4 MG disintegrating tablet Take 1 tablet (4 mg total) by mouth every 8 (eight) hours as needed for nausea or vomiting., Starting Mon 09/23/2016, Print      An After Visit Summary was printed and given to the  patient. Pt is to return in am for ultrasound.    Osie Cheeks 09/23/16 2344    Bethann Berkshire, MD 09/25/16 [phone]

## 2016-09-23 NOTE — Discharge Instructions (Signed)
Return here tomorrow for ultrasound °

## 2016-09-23 NOTE — ED Triage Notes (Signed)
Pt got up with indigestion this am. Later became dizzy and nauseated with pain to RUQ. Dizziness and nausea is now intermittent. N/v x1. lnbm this am

## 2016-09-25 ENCOUNTER — Ambulatory Visit (HOSPITAL_COMMUNITY)
Admission: RE | Admit: 2016-09-25 | Discharge: 2016-09-25 | Disposition: A | Discharge status: Home / Self Care | Payer: BLUE CROSS/BLUE SHIELD | Source: Ambulatory Visit | Attending: Physician Assistant | Admitting: Physician Assistant

## 2016-09-25 DIAGNOSIS — R1010 Upper abdominal pain, unspecified: Secondary | ICD-10-CM | POA: Insufficient documentation

## 2016-09-25 NOTE — ED Provider Notes (Signed)
Patient's ultrasound shows no acute findings that would cause her abdominal pain from yesterday. There is some possible hepatic steatosis that was relayed to the patient. Otherwise within normal limits. Follow-up per prior plan and questions answered.  [place] Abdomen Complete  Result Date: 09/25/2016 CLINICAL DATA:  Abdominal pain for 1 day. EXAM: ABDOMEN ULTRASOUND COMPLETE COMPARISON:  None. FINDINGS: Gallbladder: No gallstones or wall thickening visualized. No sonographic Murphy sign noted by sonographer. Common bile duct: Diameter: 4.6 mm Liver: No focal lesion identified. Increased hepatic parenchymal echogenicity. IVC: No abnormality visualized. Pancreas: Limited visualization secondary to overlying bowel gas. Spleen: Size and appearance within normal limits. Right Kidney: Length: 12.1 cm. Echogenicity within normal limits. No mass or hydronephrosis visualized. Left Kidney: Length: 11.2 cm. Echogenicity within normal limits. No mass or hydronephrosis visualized. Abdominal aorta: No aneurysm visualized. Other findings: None. IMPRESSION: 1. No cholelithiasis or sonographic evidence of acute cholecystitis. 2. Increased hepatic echogenicity as can be seen with hepatic steatosis. Electronically Signed   By: Elige KoHetal  Patel   On: 09/25/2016 12:01      Pricilla LovelessGoldston, Alyannah Sanks, MD 09/25/16 [phone]

## 2017-03-12 LAB — OB RESULTS CONSOLE GC/CHLAMYDIA
CHLAMYDIA, DNA PROBE: NEGATIVE
GC PROBE AMP, GENITAL: NEGATIVE

## 2017-03-12 LAB — OB RESULTS CONSOLE HIV ANTIBODY (ROUTINE TESTING): HIV: NONREACTIVE

## 2017-03-12 LAB — OB RESULTS CONSOLE HEPATITIS B SURFACE ANTIGEN: Hepatitis B Surface Ag: NEGATIVE

## 2017-03-12 LAB — OB RESULTS CONSOLE RUBELLA ANTIBODY, IGM: RUBELLA: IMMUNE

## 2017-03-12 LAB — OB RESULTS CONSOLE ABO/RH: RH Type: POSITIVE

## 2017-03-12 LAB — OB RESULTS CONSOLE RPR: RPR: NONREACTIVE

## 2017-03-12 LAB — OB RESULTS CONSOLE ANTIBODY SCREEN: Antibody Screen: NEGATIVE

## 2017-05-28 ENCOUNTER — Inpatient Hospital Stay (HOSPITAL_COMMUNITY)
Admission: AD | Admit: 2017-05-28 | Discharge: 2017-05-28 | Disposition: A | Discharge status: Home / Self Care | Payer: Commercial Managed Care - PPO | Source: Ambulatory Visit | Attending: Obstetrics and Gynecology | Admitting: Obstetrics and Gynecology

## 2017-05-28 ENCOUNTER — Encounter (HOSPITAL_COMMUNITY): Payer: Self-pay | Admitting: *Deleted

## 2017-05-28 ENCOUNTER — Other Ambulatory Visit: Payer: Self-pay

## 2017-05-28 DIAGNOSIS — Z881 Allergy status to other antibiotic agents status: Secondary | ICD-10-CM | POA: Insufficient documentation

## 2017-05-28 DIAGNOSIS — Z9889 Other specified postprocedural states: Secondary | ICD-10-CM | POA: Insufficient documentation

## 2017-05-28 DIAGNOSIS — O26892 Other specified pregnancy related conditions, second trimester: Secondary | ICD-10-CM | POA: Diagnosis not present

## 2017-05-28 DIAGNOSIS — R42 Dizziness and giddiness: Secondary | ICD-10-CM | POA: Insufficient documentation

## 2017-05-28 DIAGNOSIS — Z3A20 20 weeks gestation of pregnancy: Secondary | ICD-10-CM | POA: Insufficient documentation

## 2017-05-28 DIAGNOSIS — O212 Late vomiting of pregnancy: Secondary | ICD-10-CM | POA: Diagnosis not present

## 2017-05-28 DIAGNOSIS — O99212 Obesity complicating pregnancy, second trimester: Secondary | ICD-10-CM | POA: Insufficient documentation

## 2017-05-28 DIAGNOSIS — R12 Heartburn: Secondary | ICD-10-CM | POA: Insufficient documentation

## 2017-05-28 DIAGNOSIS — R000 Tachycardia, unspecified: Secondary | ICD-10-CM | POA: Diagnosis not present

## 2017-05-28 DIAGNOSIS — Z87440 Personal history of urinary (tract) infections: Secondary | ICD-10-CM | POA: Insufficient documentation

## 2017-05-28 DIAGNOSIS — O99612 Diseases of the digestive system complicating pregnancy, second trimester: Secondary | ICD-10-CM | POA: Insufficient documentation

## 2017-05-28 DIAGNOSIS — E669 Obesity, unspecified: Secondary | ICD-10-CM | POA: Insufficient documentation

## 2017-05-28 DIAGNOSIS — O219 Vomiting of pregnancy, unspecified: Secondary | ICD-10-CM | POA: Diagnosis not present

## 2017-05-28 LAB — COMPREHENSIVE METABOLIC PANEL
ALBUMIN: 3.2 g/dL — AB (ref 3.5–5.0)
ALT: 15 U/L (ref 14–54)
AST: 17 U/L (ref 15–41)
Alkaline Phosphatase: 67 U/L (ref 38–126)
Anion gap: 9 (ref 5–15)
BUN: 7 mg/dL (ref 6–20)
CO2: 22 mmol/L (ref 22–32)
Calcium: 8.4 mg/dL — ABNORMAL LOW (ref 8.9–10.3)
Chloride: 102 mmol/L (ref 101–111)
Creatinine, Ser: 0.37 mg/dL — ABNORMAL LOW (ref 0.44–1.00)
GFR calc Af Amer: >60 mL/min (ref >60)
GFR calc non Af Amer: >60 mL/min (ref >60)
GLUCOSE: 104 mg/dL — AB (ref 65–99)
POTASSIUM: 3.4 mmol/L — AB (ref 3.5–5.1)
Sodium: 133 mmol/L — ABNORMAL LOW (ref 135–145)
Total Bilirubin: 0.7 mg/dL (ref 0.3–1.2)
Total Protein: 7 g/dL (ref 6.5–8.1)

## 2017-05-28 LAB — URINALYSIS, ROUTINE W REFLEX MICROSCOPIC
BILIRUBIN URINE: NEGATIVE
Glucose, UA: NEGATIVE mg/dL
HGB URINE DIPSTICK: NEGATIVE
Ketones, ur: 80 mg/dL — AB
Leukocytes, UA: NEGATIVE
Nitrite: NEGATIVE
PROTEIN: NEGATIVE mg/dL
Specific Gravity, Urine: 1.027 (ref 1.005–1.030)
pH: 6 (ref 5.0–8.0)

## 2017-05-28 LAB — CBC
HCT: 37.1 % (ref 36.0–46.0)
Hemoglobin: 12.2 g/dL (ref 12.0–15.0)
MCH: 28.1 pg (ref 26.0–34.0)
MCHC: 32.9 g/dL (ref 30.0–36.0)
MCV: 85.5 fL (ref 78.0–100.0)
Platelets: 294 10*3/uL (ref 150–400)
RBC: 4.34 MIL/uL (ref 3.87–5.11)
RDW: 13.6 % (ref 11.5–15.5)
WBC: 11 10*3/uL — AB (ref 4.0–10.5)

## 2017-05-28 MED ORDER — LACTATED RINGERS IV BOLUS (SEPSIS)
1000.0000 mL | Freq: Once | INTRAVENOUS | Status: AC
  Administered 2017-05-28: 1000 mL via INTRAVENOUS

## 2017-05-28 MED ORDER — SCOPOLAMINE 1 MG/3DAYS TD PT72
1.0000 | MEDICATED_PATCH | TRANSDERMAL | Status: DC
  Administered 2017-05-28: 1.5 mg via TRANSDERMAL
  Filled 2017-05-28: qty 1

## 2017-05-28 MED ORDER — PROMETHAZINE HCL 25 MG/ML IJ SOLN
25.0000 mg | Freq: Once | INTRAMUSCULAR | Status: AC
  Administered 2017-05-28: 25 mg via INTRAVENOUS
  Filled 2017-05-28: qty 1

## 2017-05-28 MED ORDER — METOCLOPRAMIDE HCL 5 MG/ML IJ SOLN
10.0000 mg | Freq: Once | INTRAMUSCULAR | Status: AC
Start: 2017-05-28 — End: 2017-05-28
  Administered 2017-05-28: 10 mg via INTRAVENOUS
  Filled 2017-05-28: qty 2

## 2017-05-28 MED ORDER — M.V.I. ADULT IV INJ
Freq: Once | INTRAVENOUS | Status: AC
  Administered 2017-05-28: 14:00:00 via INTRAVENOUS
  Filled 2017-05-28: qty 1000

## 2017-05-28 MED ORDER — METOCLOPRAMIDE HCL 10 MG PO TABS: 10.0000 mg | ORAL_TABLET | Freq: Three times a day (TID) | ORAL | 2 refills | Status: DC

## 2017-05-28 MED ORDER — ONDANSETRON 4 MG PO TBDP: 4.0000 mg | ORAL_TABLET | Freq: Three times a day (TID) | ORAL | 0 refills | Status: DC | PRN

## 2017-05-28 NOTE — MAU Note (Signed)
Pharmacy called, IV fluid not available yet.

## 2017-05-28 NOTE — MAU Note (Signed)
Dizziness has improved but nausea still present

## 2017-05-28 NOTE — MAU Note (Addendum)
Been feeling dizzy for the past 2 wks, getting worse. Has hx of anemia. Started throwing up last night, has continued today. Denies fever or diarrhea.  ? Exposure, works ems.

## 2017-05-28 NOTE — MAU Provider Note (Signed)
Chief Complaint: Emesis and Dizziness   First Provider Initiated Contact with Patient 05/28/17 1102      SUBJECTIVE HPI: Megan Barton is a 27 y.o. G2P1001 at 4319w5d by LMP who presents to maternity admissions reporting dizziness x 2 weeks with sudden onset of n/v this morning. She reports emesis x 6 times prior to MAU arrival. She reports she was drinking lots of water and eating regularly before today but has not kept anything down today. She denies any sick contacts. She has Zofran at home but it is expired so she hasn't taken anything for nausea today.  She reports the dizziness is worse when lying down on her back or leaning back in a chair and improved when sitting upright or leaning forward. It is made worse by standing also.  The dizziness is described as a feeling like the room is spinning.  It is associated with an intermittent sharp pain behind her right eye that does not radiate. The pain started 2 weeks ago when the dizziness started.  She has taken Tylenol for the h/a which usually resolves it. She denies h/a currently and has not taken Tylenol today.  There are no other associated symptoms. She has not tried any other treatments.      HPI  Past Medical History:  Diagnosis Date  . Anemia   . Heartburn in pregnancy    zantac  . Obesity 01/06/2013  . Patient desires pregnancy 11/06/2012   resolved  . UTI (lower urinary tract infection) 01/06/2013   resolved   Past Surgical History:  Procedure Laterality Date  . CESAREAN SECTION N/A 02/10/2014   Procedure: CESAREAN SECTION;  Surgeon: Mitchel HonourMegan Morris, DO;  Location: [facility];  Service: Obstetrics;  Laterality: N/A;  Primary edc 02/09/14  . WISDOM TOOTH EXTRACTION     Social History   Socioeconomic History  . Marital status: Single    Spouse name: Not on file  . Number of children: Not on file  . Years of education: Not on file  . Highest education level: Not on file  Social Needs  . Financial resource strain: Not on file  .  Food insecurity - worry: Not on file  . Food insecurity - inability: Not on file  . Transportation needs - medical: Not on file  . Transportation needs - non-medical: Not on file  Occupational History  . Not on file  Tobacco Use  . Smoking status: Never Smoker  . Smokeless tobacco: Never Used  Substance and Sexual Activity  . Alcohol use: No  . Drug use: No  . Sexual activity: Yes    Birth control/protection: None    Comment: pregnant  Other Topics Concern  . Not on file  Social History Narrative  . Not on file   No current facility-administered medications on file prior to encounter.    Current Outpatient Medications on File Prior to Encounter  Medication Sig Dispense Refill  . DICLEGIS 10-10 MG TBEC     . Prenatal Vit-Fe Fumarate-FA (PRENATAL MULTIVITAMIN) TABS tablet Take 1 tablet by mouth daily at 12 noon.    Marland Kitchen. HYDROcodone-acetaminophen (NORCO/VICODIN) 5-325 MG tablet Take 2 tablets by mouth every 4 (four) hours as needed. (Patient not taking: Reported on 05/28/2017) 10 tablet 0  . ondansetron (ZOFRAN ODT) 4 MG disintegrating tablet Take 1 tablet (4 mg total) by mouth every 8 (eight) hours as needed for nausea or vomiting. (Patient not taking: Reported on 05/28/2017) 20 tablet 0   Allergies  Allergen Reactions  .  Keflex [Cephalexin] Rash    ROS:  Review of Systems  Constitutional: Negative for chills and fever.  HENT: Negative for congestion, rhinorrhea and sinus pressure.   Respiratory: Negative for cough and shortness of breath.   Cardiovascular: Negative for chest pain.  Gastrointestinal: Positive for nausea and vomiting.  Genitourinary: Negative for dysuria, frequency and urgency.  Musculoskeletal: Negative.   Neurological: Positive for dizziness and headaches.     I have reviewed patient's Past Medical Hx, Surgical Hx, Family Hx, Social Hx, medications and allergies.   Physical Exam   Patient Vitals for the past 24 hrs:  BP Temp Temp src Resp SpO2 Weight   05/28/17 1046 127/74 98.5 F (36.9 C) Oral 20 100 % 268 lb 12.8 oz (121.9 kg)  05/28/17 1045 - - - - 100 % -    Orthostatic VS for the past 24 hrs (Last 3 readings):  BP- Lying Pulse- Lying BP- Sitting Pulse- Sitting BP- Standing at 0 minutes Pulse- Standing at 0 minutes BP- Standing at 3 minutes Pulse- Standing at 3 minutes  05/28/17 1557 - - - - - - 137/57 122  05/28/17 1554 - - - - 131/60 119 - -  05/28/17 1553 - - 134/58 115 - - - -  05/28/17 1552 125/51 117 - - - - - -  05/28/17 1454 131/54 107 116/63 116 113/61 129 121/62 137   Constitutional: Well-developed, well-nourished female in mild distress.  HEART: normal rate, heart sounds, regular rhythm RESP: normal effort, lung sounds clear and equal bilaterally GI: Abd soft, non-tender. Pos BS x 4 MS: Extremities nontender, no edema, normal ROM Neuro: PERRLA, alert, oriented, normal speech, no focal findings or movement disorder noted, screening mental status exam normal, neck supple without rigidity, cranial nerves II through XII intact, DTR's normal and symmetric, motor and sensory grossly normal bilaterally, normal muscle tone, no tremors, strength 5/5 GU: Neg CVAT.   FHT 154 by doppler  EKG: sinus tachycardia.  LAB RESULTS Results for orders placed or performed during the hospital encounter of 05/28/17 (from the past 24 hour(s))  Urinalysis, Routine w reflex microscopic     Status: Abnormal   Collection Time: 05/28/17 10:40 AM  Result Value Ref Range   Color, Urine YELLOW YELLOW   APPearance CLEAR CLEAR   Specific Gravity, Urine 1.027 1.005 - 1.030   pH 6.0 5.0 - 8.0   Glucose, UA NEGATIVE NEGATIVE mg/dL   Hgb urine dipstick NEGATIVE NEGATIVE   Bilirubin Urine NEGATIVE NEGATIVE   Ketones, ur 80 (A) NEGATIVE mg/dL   Protein, ur NEGATIVE NEGATIVE mg/dL   Nitrite NEGATIVE NEGATIVE   Leukocytes, UA NEGATIVE NEGATIVE  CBC     Status: Abnormal   Collection Time: 05/28/17 11:23 AM  Result Value Ref Range   WBC 11.0 (H)  4.0 - 10.5 K/uL   RBC 4.34 3.87 - 5.11 MIL/uL   Hemoglobin 12.2 12.0 - 15.0 g/dL   HCT 16.1 09.6 - 04.5 %   MCV 85.5 78.0 - 100.0 fL   MCH 28.1 26.0 - 34.0 pg   MCHC 32.9 30.0 - 36.0 g/dL   RDW 40.9 81.1 - 91.4 %   Platelets 294 150 - 400 K/uL  Comprehensive metabolic panel     Status: Abnormal   Collection Time: 05/28/17 11:23 AM  Result Value Ref Range   Sodium 133 (L) 135 - 145 mmol/L   Potassium 3.4 (L) 3.5 - 5.1 mmol/L   Chloride 102 101 - 111 mmol/L   CO2 22 22 -  32 mmol/L   Glucose, Bld 104 (H) 65 - 99 mg/dL   BUN 7 6 - 20 mg/dL   Creatinine, Ser 1.61 (L) 0.44 - 1.00 mg/dL   Calcium 8.4 (L) 8.9 - 10.3 mg/dL   Total Protein 7.0 6.5 - 8.1 g/dL   Albumin 3.2 (L) 3.5 - 5.0 g/dL   AST 17 15 - 41 U/L   ALT 15 14 - 54 U/L   Alkaline Phosphatase 67 38 - 126 U/L   Total Bilirubin 0.7 0.3 - 1.2 mg/dL   GFR calc non Af Amer >60 >60 mL/min   GFR calc Af Amer >60 >60 mL/min   Anion gap 9 5 - 15       IMAGING No results found.  MAU Management/MDM: CBC, CMP, UA, EKG No evidence of anemia. Potassium 3.4, slightly below normal but likely due to n/v today. UA with 80 ketones so will replace fluids.  Orthostatic VS symptomatic for pulse increase only, improved after 3000 ml IV fluids.  Mild tachycardia persists today, however, despite fluid replacement.  No chest pain or shortness of breath today, no known cardiac history and EKG otherwise wnl.  Phenergan 25 mg IV and Reglan 10 mg IV given with improved nausea.  Pt declined further positional evaluations for vertigo because she does not want to worsen her symptoms.  Consult Dr Renaldo Fiddler with assessment and findings.  Will place scopolamine patch today in MAU. Pt to wear patch x 3 days.  F/U in office early next week if symptoms persist, consider referral to ENT for vertigo evaluation or cardiology if tachycardia persists despite rehydration.  Pt discharged with strict precautions.  ASSESSMENT 1. Dizziness   2. Nausea and vomiting during  pregnancy prior to [redacted] weeks gestation   3. Tachycardia, unspecified     PLAN Discharge home  Allergies as of 05/28/2017      Reactions   Keflex [cephalexin] Rash      Medication List    STOP taking these medications   HYDROcodone-acetaminophen 5-325 MG tablet Commonly known as:  NORCO/VICODIN     TAKE these medications   DICLEGIS 10-10 MG Tbec Generic drug:  Doxylamine-Pyridoxine   metoCLOPramide 10 MG tablet Commonly known as:  REGLAN Take 1 tablet (10 mg total) by mouth 3 (three) times daily before meals.   ondansetron 4 MG disintegrating tablet Commonly known as:  ZOFRAN ODT Take 1 tablet (4 mg total) by mouth every 8 (eight) hours as needed for nausea or vomiting.   prenatal multivitamin Tabs tablet Take 1 tablet by mouth daily at 12 noon.        Sharen Counter Certified Nurse-Midwife 05/28/2017  1:41 PM

## 2017-05-28 NOTE — MAU Note (Signed)
Urine in lab 

## 2017-09-18 ENCOUNTER — Telehealth (HOSPITAL_COMMUNITY): Payer: Self-pay | Admitting: *Deleted

## 2017-09-18 NOTE — Telephone Encounter (Signed)
Preadmission screen  

## 2017-09-19 ENCOUNTER — Telehealth (HOSPITAL_COMMUNITY): Payer: Self-pay | Admitting: *Deleted

## 2017-09-19 NOTE — Telephone Encounter (Signed)
Preadmission screen  

## 2017-09-22 ENCOUNTER — Telehealth (HOSPITAL_COMMUNITY): Payer: Self-pay | Admitting: *Deleted

## 2017-09-22 NOTE — Telephone Encounter (Signed)
Preadmission screen  

## 2017-09-23 ENCOUNTER — Encounter (HOSPITAL_COMMUNITY): Payer: Self-pay

## 2017-09-29 ENCOUNTER — Encounter (HOSPITAL_COMMUNITY): Payer: Self-pay | Admitting: *Deleted

## 2017-09-29 ENCOUNTER — Encounter (HOSPITAL_COMMUNITY)
Admission: AD | Disposition: A | Discharge status: Home / Self Care | Payer: Self-pay | Source: Home / Self Care | Attending: Obstetrics & Gynecology

## 2017-09-29 ENCOUNTER — Inpatient Hospital Stay (HOSPITAL_COMMUNITY)
Admission: AD | Admit: 2017-09-29 | Discharge: 2017-10-02 | DRG: 788 | Disposition: A | Discharge status: Home / Self Care | Payer: Commercial Managed Care - PPO | Attending: Obstetrics & Gynecology | Admitting: Obstetrics & Gynecology

## 2017-09-29 ENCOUNTER — Inpatient Hospital Stay (HOSPITAL_COMMUNITY): Payer: Commercial Managed Care - PPO | Admitting: Anesthesiology

## 2017-09-29 DIAGNOSIS — O34211 Maternal care for low transverse scar from previous cesarean delivery: Secondary | ICD-10-CM | POA: Diagnosis present

## 2017-09-29 DIAGNOSIS — O99214 Obesity complicating childbirth: Secondary | ICD-10-CM | POA: Diagnosis present

## 2017-09-29 DIAGNOSIS — Z3A38 38 weeks gestation of pregnancy: Secondary | ICD-10-CM

## 2017-09-29 DIAGNOSIS — Z98891 History of uterine scar from previous surgery: Secondary | ICD-10-CM

## 2017-09-29 DIAGNOSIS — D649 Anemia, unspecified: Secondary | ICD-10-CM | POA: Diagnosis present

## 2017-09-29 DIAGNOSIS — O4292 Full-term premature rupture of membranes, unspecified as to length of time between rupture and onset of labor: Principal | ICD-10-CM | POA: Diagnosis present

## 2017-09-29 DIAGNOSIS — O9902 Anemia complicating childbirth: Secondary | ICD-10-CM | POA: Diagnosis present

## 2017-09-29 LAB — CBC
HCT: 38.3 % (ref 36.0–46.0)
Hemoglobin: 12.2 g/dL (ref 12.0–15.0)
MCH: 26.1 pg (ref 26.0–34.0)
MCHC: 31.9 g/dL (ref 30.0–36.0)
MCV: 82 fL (ref 78.0–100.0)
PLATELETS: 280 10*3/uL (ref 150–400)
RBC: 4.67 MIL/uL (ref 3.87–5.11)
RDW: 14.2 % (ref 11.5–15.5)
WBC: 10.5 10*3/uL (ref 4.0–10.5)

## 2017-09-29 LAB — POCT FERN TEST: POCT FERN TEST: POSITIVE

## 2017-09-29 LAB — TYPE AND SCREEN
ABO/RH(D): B POS
ANTIBODY SCREEN: NEGATIVE

## 2017-09-29 SURGERY — Surgical Case
Anesthesia: Spinal

## 2017-09-29 MED ORDER — DEXAMETHASONE SODIUM PHOSPHATE 4 MG/ML IJ SOLN
INTRAMUSCULAR | Status: DC | PRN
  Administered 2017-09-29: 4 mg via INTRAVENOUS

## 2017-09-29 MED ORDER — PRENATAL MULTIVITAMIN CH
1.0000 | ORAL_TABLET | Freq: Every day | ORAL | Status: DC
  Administered 2017-10-01: 1 via ORAL
  Filled 2017-09-29 (×2): qty 1

## 2017-09-29 MED ORDER — DIBUCAINE 1 % RE OINT: 1.0000 "application " | TOPICAL_OINTMENT | RECTAL | Status: DC | PRN

## 2017-09-29 MED ORDER — MENTHOL 3 MG MT LOZG: 1.0000 | LOZENGE | OROMUCOSAL | Status: DC | PRN

## 2017-09-29 MED ORDER — NALBUPHINE HCL 10 MG/ML IJ SOLN: 5.0000 mg | INTRAMUSCULAR | Status: DC | PRN

## 2017-09-29 MED ORDER — METOCLOPRAMIDE HCL 5 MG/ML IJ SOLN
INTRAMUSCULAR | Status: DC | PRN
  Administered 2017-09-29: 10 mg via INTRAVENOUS

## 2017-09-29 MED ORDER — SIMETHICONE 80 MG PO CHEW
80.0000 mg | CHEWABLE_TABLET | ORAL | Status: DC
  Administered 2017-09-29 – 2017-10-01 (×3): 80 mg via ORAL
  Filled 2017-09-29 (×3): qty 1

## 2017-09-29 MED ORDER — SIMETHICONE 80 MG PO CHEW: 80.0000 mg | CHEWABLE_TABLET | ORAL | Status: DC | PRN

## 2017-09-29 MED ORDER — OXYCODONE-ACETAMINOPHEN 5-325 MG PO TABS: 1.0000 | ORAL_TABLET | ORAL | Status: DC | PRN

## 2017-09-29 MED ORDER — DIPHENHYDRAMINE HCL 25 MG PO CAPS
25.0000 mg | ORAL_CAPSULE | Freq: Four times a day (QID) | ORAL | Status: DC | PRN
  Administered 2017-09-30: 25 mg via ORAL
  Filled 2017-09-29: qty 1

## 2017-09-29 MED ORDER — FENTANYL CITRATE (PF) 100 MCG/2ML IJ SOLN
INTRAMUSCULAR | Status: AC
  Filled 2017-09-29: qty 2

## 2017-09-29 MED ORDER — BUPIVACAINE IN DEXTROSE 0.75-8.25 % IT SOLN
INTRATHECAL | Status: DC | PRN
  Administered 2017-09-29: 2 mL via INTRATHECAL

## 2017-09-29 MED ORDER — FAMOTIDINE IN NACL 20-0.9 MG/50ML-% IV SOLN
20.0000 mg | Freq: Once | INTRAVENOUS | Status: AC
  Administered 2017-09-29: 20 mg via INTRAVENOUS
  Filled 2017-09-29: qty 50

## 2017-09-29 MED ORDER — TETANUS-DIPHTH-ACELL PERTUSSIS 5-2.5-18.5 LF-MCG/0.5 IM SUSP: 0.5000 mL | Freq: Once | INTRAMUSCULAR | Status: DC

## 2017-09-29 MED ORDER — LACTATED RINGERS IV SOLN
INTRAVENOUS | Status: DC | PRN
  Administered 2017-09-29: 12:00:00 via INTRAVENOUS

## 2017-09-29 MED ORDER — PHENYLEPHRINE 40 MCG/ML (10ML) SYRINGE FOR IV PUSH (FOR BLOOD PRESSURE SUPPORT)
PREFILLED_SYRINGE | INTRAVENOUS | Status: AC
  Filled 2017-09-29: qty 20

## 2017-09-29 MED ORDER — SCOPOLAMINE 1 MG/3DAYS TD PT72
MEDICATED_PATCH | TRANSDERMAL | Status: DC | PRN
  Administered 2017-09-29: 1 via TRANSDERMAL

## 2017-09-29 MED ORDER — FENTANYL CITRATE (PF) 100 MCG/2ML IJ SOLN
INTRAMUSCULAR | Status: DC | PRN
  Administered 2017-09-29: 20 ug via INTRATHECAL

## 2017-09-29 MED ORDER — NALOXONE HCL 4 MG/10ML IJ SOLN: 1.0000 ug/kg/h | INTRAMUSCULAR | Status: DC | PRN

## 2017-09-29 MED ORDER — NALBUPHINE HCL 10 MG/ML IJ SOLN: 5.0000 mg | Freq: Once | INTRAMUSCULAR | Status: DC | PRN

## 2017-09-29 MED ORDER — SCOPOLAMINE 1 MG/3DAYS TD PT72
MEDICATED_PATCH | TRANSDERMAL | Status: AC
  Filled 2017-09-29: qty 1

## 2017-09-29 MED ORDER — SODIUM CHLORIDE 0.9 % IR SOLN
Status: DC | PRN
  Administered 2017-09-29: 1

## 2017-09-29 MED ORDER — ONDANSETRON HCL 4 MG/2ML IJ SOLN
INTRAMUSCULAR | Status: DC | PRN
  Administered 2017-09-29: 4 mg via INTRAVENOUS

## 2017-09-29 MED ORDER — ZOLPIDEM TARTRATE 5 MG PO TABS: 5.0000 mg | ORAL_TABLET | Freq: Every evening | ORAL | Status: DC | PRN

## 2017-09-29 MED ORDER — OXYTOCIN 10 UNIT/ML IJ SOLN
INTRAVENOUS | Status: DC | PRN
  Administered 2017-09-29: 40 [IU] via INTRAVENOUS

## 2017-09-29 MED ORDER — LACTATED RINGERS IV SOLN
INTRAVENOUS | Status: DC
  Administered 2017-09-29 (×2): via INTRAVENOUS

## 2017-09-29 MED ORDER — NALOXONE HCL 0.4 MG/ML IJ SOLN: 0.4000 mg | INTRAMUSCULAR | Status: DC | PRN

## 2017-09-29 MED ORDER — OXYTOCIN 40 UNITS IN LACTATED RINGERS INFUSION - SIMPLE MED: 2.5000 [IU]/h | INTRAVENOUS | Status: AC

## 2017-09-29 MED ORDER — DEXAMETHASONE SODIUM PHOSPHATE 4 MG/ML IJ SOLN
INTRAMUSCULAR | Status: AC
  Filled 2017-09-29: qty 1

## 2017-09-29 MED ORDER — LACTATED RINGERS IV SOLN
INTRAVENOUS | Status: DC
  Administered 2017-09-29: 1 mL via INTRAVENOUS
  Administered 2017-09-30: 01:00:00 via INTRAVENOUS

## 2017-09-29 MED ORDER — ACETAMINOPHEN 325 MG PO TABS
650.0000 mg | ORAL_TABLET | ORAL | Status: DC | PRN
Start: 2017-09-29 — End: 2017-10-02

## 2017-09-29 MED ORDER — OXYTOCIN 10 UNIT/ML IJ SOLN
INTRAMUSCULAR | Status: AC
  Filled 2017-09-29: qty 4

## 2017-09-29 MED ORDER — COCONUT OIL OIL: 1.0000 "application " | TOPICAL_OIL | Status: DC | PRN

## 2017-09-29 MED ORDER — KETOROLAC TROMETHAMINE 30 MG/ML IJ SOLN: 30.0000 mg | Freq: Four times a day (QID) | INTRAMUSCULAR | Status: AC | PRN

## 2017-09-29 MED ORDER — MORPHINE SULFATE (PF) 0.5 MG/ML IJ SOLN
INTRAMUSCULAR | Status: DC | PRN
  Administered 2017-09-29: .2 mg via INTRATHECAL

## 2017-09-29 MED ORDER — ONDANSETRON HCL 4 MG/2ML IJ SOLN: 4.0000 mg | Freq: Three times a day (TID) | INTRAMUSCULAR | Status: DC | PRN

## 2017-09-29 MED ORDER — ONDANSETRON HCL 4 MG/2ML IJ SOLN
INTRAMUSCULAR | Status: AC
  Filled 2017-09-29: qty 2

## 2017-09-29 MED ORDER — METOCLOPRAMIDE HCL 5 MG/ML IJ SOLN
INTRAMUSCULAR | Status: AC
  Filled 2017-09-29: qty 2

## 2017-09-29 MED ORDER — MORPHINE SULFATE (PF) 0.5 MG/ML IJ SOLN
INTRAMUSCULAR | Status: AC
  Filled 2017-09-29: qty 10

## 2017-09-29 MED ORDER — PHENYLEPHRINE 40 MCG/ML (10ML) SYRINGE FOR IV PUSH (FOR BLOOD PRESSURE SUPPORT)
PREFILLED_SYRINGE | INTRAVENOUS | Status: DC | PRN
  Administered 2017-09-29 (×3): 80 ug via INTRAVENOUS

## 2017-09-29 MED ORDER — WITCH HAZEL-GLYCERIN EX PADS: 1.0000 "application " | MEDICATED_PAD | CUTANEOUS | Status: DC | PRN

## 2017-09-29 MED ORDER — LACTATED RINGERS IV BOLUS
1000.0000 mL | Freq: Once | INTRAVENOUS | Status: AC
  Administered 2017-09-29: 1000 mL via INTRAVENOUS

## 2017-09-29 MED ORDER — SENNOSIDES-DOCUSATE SODIUM 8.6-50 MG PO TABS
2.0000 | ORAL_TABLET | ORAL | Status: DC
  Administered 2017-09-29 – 2017-10-01 (×3): 2 via ORAL
  Filled 2017-09-29 (×3): qty 2

## 2017-09-29 MED ORDER — GENTAMICIN SULFATE 40 MG/ML IJ SOLN
1.5000 mg/kg | Freq: Once | INTRAVENOUS | Status: DC
  Filled 2017-09-29: qty 3.5

## 2017-09-29 MED ORDER — PHENYLEPHRINE 8 MG IN D5W 100 ML (0.08MG/ML) PREMIX OPTIME
INJECTION | INTRAVENOUS | Status: DC | PRN
  Administered 2017-09-29: 60 ug/min via INTRAVENOUS

## 2017-09-29 MED ORDER — IBUPROFEN 600 MG PO TABS
600.0000 mg | ORAL_TABLET | Freq: Four times a day (QID) | ORAL | Status: DC
  Administered 2017-09-29 – 2017-10-02 (×11): 600 mg via ORAL
  Filled 2017-09-29 (×11): qty 1

## 2017-09-29 MED ORDER — SIMETHICONE 80 MG PO CHEW
80.0000 mg | CHEWABLE_TABLET | Freq: Three times a day (TID) | ORAL | Status: DC
  Administered 2017-09-29 – 2017-10-02 (×7): 80 mg via ORAL
  Filled 2017-09-29 (×7): qty 1

## 2017-09-29 MED ORDER — DIPHENHYDRAMINE HCL 25 MG PO CAPS
25.0000 mg | ORAL_CAPSULE | ORAL | Status: DC | PRN
  Administered 2017-09-30: 25 mg via ORAL
  Filled 2017-09-29: qty 1

## 2017-09-29 MED ORDER — FENTANYL CITRATE (PF) 100 MCG/2ML IJ SOLN: 25.0000 ug | INTRAMUSCULAR | Status: DC | PRN

## 2017-09-29 MED ORDER — MEPERIDINE HCL 25 MG/ML IJ SOLN: 6.2500 mg | INTRAMUSCULAR | Status: DC | PRN

## 2017-09-29 MED ORDER — SOD CITRATE-CITRIC ACID 500-334 MG/5ML PO SOLN
30.0000 mL | Freq: Once | ORAL | Status: AC
  Administered 2017-09-29: 30 mL via ORAL
  Filled 2017-09-29: qty 15

## 2017-09-29 MED ORDER — SODIUM CHLORIDE 0.9% FLUSH: 3.0000 mL | INTRAVENOUS | Status: DC | PRN

## 2017-09-29 MED ORDER — OXYCODONE-ACETAMINOPHEN 5-325 MG PO TABS
2.0000 | ORAL_TABLET | ORAL | Status: DC | PRN
  Filled 2017-09-29: qty 2

## 2017-09-29 MED ORDER — DIPHENHYDRAMINE HCL 50 MG/ML IJ SOLN: 12.5000 mg | INTRAMUSCULAR | Status: DC | PRN

## 2017-09-29 MED ORDER — PHENYLEPHRINE 8 MG IN D5W 100 ML (0.08MG/ML) PREMIX OPTIME
INJECTION | INTRAVENOUS | Status: AC
  Filled 2017-09-29: qty 100

## 2017-09-29 SURGICAL SUPPLY — 39 items
ADH SKN CLS APL DERMABOND .7 (GAUZE/BANDAGES/DRESSINGS)
APL SKNCLS STERI-STRIP NONHPOA (GAUZE/BANDAGES/DRESSINGS) ×1
BENZOIN TINCTURE PRP APPL 2/3 (GAUZE/BANDAGES/DRESSINGS) ×2 IMPLANT
CHLORAPREP W/TINT 26ML (MISCELLANEOUS) ×3 IMPLANT
CLAMP CORD UMBIL (MISCELLANEOUS) IMPLANT
CLOSURE STERI-STRIP 1/2X4 (GAUZE/BANDAGES/DRESSINGS) ×1
CLOSURE WOUND 1/2 X4 (GAUZE/BANDAGES/DRESSINGS)
CLOTH BEACON ORANGE TIMEOUT ST (SAFETY) ×3 IMPLANT
CLSR STERI-STRIP ANTIMIC 1/2X4 (GAUZE/BANDAGES/DRESSINGS) ×1 IMPLANT
DERMABOND ADVANCED (GAUZE/BANDAGES/DRESSINGS)
DERMABOND ADVANCED .7 DNX12 (GAUZE/BANDAGES/DRESSINGS) IMPLANT
DRSG OPSITE POSTOP 4X10 (GAUZE/BANDAGES/DRESSINGS) ×3 IMPLANT
ELECT REM PT RETURN 9FT ADLT (ELECTROSURGICAL) ×3
ELECTRODE REM PT RTRN 9FT ADLT (ELECTROSURGICAL) ×1 IMPLANT
EXTRACTOR VACUUM M CUP 4 TUBE (SUCTIONS) IMPLANT
EXTRACTOR VACUUM M CUP 4' TUBE (SUCTIONS)
GLOVE BIO SURGEON STRL SZ7.5 (GLOVE) ×3 IMPLANT
GLOVE BIOGEL PI IND STRL 7.0 (GLOVE) ×1 IMPLANT
GLOVE BIOGEL PI INDICATOR 7.0 (GLOVE) ×2
GOWN STRL REUS W/TWL LRG LVL3 (GOWN DISPOSABLE) ×6 IMPLANT
HOVERMATT SINGLE USE (MISCELLANEOUS) ×2 IMPLANT
KIT ABG SYR 3ML LUER SLIP (SYRINGE) ×3 IMPLANT
NDL HYPO 25X5/8 SAFETYGLIDE (NEEDLE) ×1 IMPLANT
NEEDLE HYPO 25X5/8 SAFETYGLIDE (NEEDLE) ×3 IMPLANT
NS IRRIG 1000ML POUR BTL (IV SOLUTION) ×3 IMPLANT
PACK C SECTION WH (CUSTOM PROCEDURE TRAY) ×3 IMPLANT
PAD OB MATERNITY 4.3X12.25 (PERSONAL CARE ITEMS) ×3 IMPLANT
PENCIL SMOKE EVAC W/HOLSTER (ELECTROSURGICAL) ×3 IMPLANT
STRIP CLOSURE SKIN 1/2X4 (GAUZE/BANDAGES/DRESSINGS) IMPLANT
SUT MNCRL 0 VIOLET CTX 36 (SUTURE) ×4 IMPLANT
SUT MONOCRYL 0 CTX 36 (SUTURE) ×8
SUT PDS AB 0 CTX 60 (SUTURE) ×3 IMPLANT
SUT PLAIN 0 NONE (SUTURE) IMPLANT
SUT PLAIN 2 0 (SUTURE)
SUT PLAIN 2 0 XLH (SUTURE) ×2 IMPLANT
SUT PLAIN ABS 2-0 CT1 27XMFL (SUTURE) IMPLANT
SUT VIC AB 4-0 KS 27 (SUTURE) ×3 IMPLANT
TOWEL OR 17X24 6PK STRL BLUE (TOWEL DISPOSABLE) ×3 IMPLANT
TRAY FOLEY W/BAG SLVR 14FR LF (SET/KITS/TRAYS/PACK) ×3 IMPLANT

## 2017-09-29 NOTE — Op Note (Signed)
Megan Barton PROCEDURE DATE: 09/29/2017  PREOPERATIVE DIAGNOSIS: Intrauterine pregnancy at  6257w3d weeks gestation, PROM, previous C/S x 1 with desire for repeat  POSTOPERATIVE DIAGNOSIS: The same  PROCEDURE:  Repeat Low Transverse Cesarean Section  SURGEON:  Dr. Mitchel HonourMegan Sheylin Scharnhorst  INDICATIONS: Megan Barton is a 27 y.o. G2P1001 at 4157w3d scheduled for cesarean section secondary to PROM with desire for repeat.  The risks of cesarean section discussed with the patient included but were not limited to: bleeding which may require transfusion or reoperation; infection which may require antibiotics; injury to bowel, bladder, ureters or other surrounding organs; injury to the fetus; need for additional procedures including hysterectomy in the event of a life-threatening hemorrhage; placental abnormalities wth subsequent pregnancies, incisional problems, thromboembolic phenomenon and other postoperative/anesthesia complications. The patient concurred with the proposed plan, giving informed written consent for the procedure.    FINDINGS:  Viable female infant in cephalic presentation, APGARs 1,9,15,7,8:  Weight pending  Copious cleaer amniotic fluid.  Intact placenta, three vessel cord.  Grossly normal uterus, ovaries and fallopian tubes. .   ANESTHESIA:  Spinal ESTIMATED BLOOD LOSS: 326 mL ml SPECIMENS: Placenta sent to L&D COMPLICATIONS: None immediate  PROCEDURE IN DETAIL:  The patient received intravenous antibiotics and had sequential compression devices applied to her lower extremities while in the preoperative area.  She was then taken to the operating room where spinal anesthesia was administered and was found to be adequate. She was then placed in a dorsal supine position with a leftward tilt, and prepped and draped in a sterile manner.  A foley catheter was placed into her bladder and attached to constant gravity.  After an adequate timeout was performed, a Pfannenstiel skin incision was made with scalpel  and carried through to the underlying layer of fascia. The fascia was incised in the midline and this incision was extended bilaterally using the Mayo scissors. Kocher clamps were applied to the superior aspect of the fascial incision and the underlying rectus muscles were dissected off bluntly. A similar process was carried out on the inferior aspect of the facial incision. The rectus muscles were separated in the midline bluntly and the peritoneum was entered bluntly. Bladder flap was created sharply and developed bluntly.  Bladder blade was placed.  A transverse hysterotomy was made with a scalpel and extended bilaterally bluntly. The bladder blade was then removed. The infant was successfully delivered using a single Kiwi vacuum pull, and cord was clamped and cut and infant was handed over to awaiting neonatology team. Uterine massage was then administered and the placenta delivered intact with three-vessel cord. The uterus was cleared of clot and debris.  The hysterotomy was closed with 0 chromic.  A second imbricating suture of 0-chromic was used to reinforce the incision and aid in hemostasis.  The peritoneum and rectus muscles were noted to be hemostatic and were reapproximated using 3-0 monocryl in a running fashion.  The fascia was closed with double looped PDS in a running fashion with good restoration of anatomy.  The subcutaneus tissue was copiously irrigated and irrigated with three interrupted plain gut sutures.  The skin was closed with 4-0 vicryl in a subcuticular fashion.  Pt tolerated the procedure will.  All counts were correct x2.  Pt went to the recovery room in stable condition.

## 2017-09-29 NOTE — Lactation Note (Signed)
This note was copied from a baby's chart. Lactation Consultation Note  Patient Name: Megan Barton Today's Date: 09/29/2017 Reason for consult: Initial assessment;Early term 7437-38.6wks  P2 mother whose infant is now 338 hours old.  Mother breastfed her first child who is now 883 1/2 for a couple of months.  Mother was using the DEBP as I entered and baby was asleep in the bassinet.  Mother stated her preference was to pump and bottle feed.  She was concerned that baby was sleepy and did not seem to want to feed.  She felt it would be better to pump and bottle feed.  She already had a curved tip syringe, feeding spoon and feeding cup in her room and did not like any of these tools.  When asked how she wanted to supplement she stated, "with the bottle nipple."    After speaking with her and reviewing breastfeeding basics, she decided to try to breastfeed.  Her breasts are soft and non tender and her nipples are short shafted bilaterally.  I provided breast shells and a manual pump with instructions for use to help evert her nipples.  Encouraged feeding baby 8-12 times/24 hours or earlier if he shows feeding cues, STS, breast massage and hand expression.  Mother will continue to observe baby and will call for latch assistance as needed throughout the night.  Mom made aware of O/P services, breastfeeding support groups, community resources, and our phone # for post-discharge questions. Father present and supportive.  RN updated.   Maternal Data Formula Feeding for Exclusion: No Has patient been taught Hand Expression?: Yes Does the patient have breastfeeding experience prior to this delivery?: Yes  Feeding Feeding Type: Formula  LATCH Score                   Interventions    Lactation Tools Discussed/Used Tools: Shells Shell Type: Inverted Pump Review: Setup, frequency, and cleaning;Milk Storage Initiated by:: Amine Adelson (set up manual) Date initiated:: 09/30/17   Consult  Status Consult Status: Follow-up Date: 09/30/17 Follow-up type: In-patient    Krissi Willaims R Mordche Hedglin 09/29/2017, 8:20 PM

## 2017-09-29 NOTE — Transfer of Care (Signed)
Immediate Anesthesia Transfer of Care Note  Patient: Megan Barton  Procedure(s) Performed: REPEAT CESAREAN SECTION (N/A )  Patient Location: PACU  Anesthesia Type:Spinal  Level of Consciousness: awake, alert  and oriented  Airway & Oxygen Therapy: Patient Spontanous Breathing  Post-op Assessment: Report given to RN and Post -op Vital signs reviewed and stable  Post vital signs: Reviewed and stable  Last Vitals:  Vitals Value Taken Time  BP    Temp    Pulse    Resp    SpO2      Last Pain:  Vitals:   09/29/17 1005  TempSrc: Oral         Complications: No apparent anesthesia complications

## 2017-09-29 NOTE — Anesthesia Procedure Notes (Signed)
Spinal  Patient location during procedure: OR Start time: 09/29/2017 11:29 AM Staffing Anesthesiologist: Mal AmabileFoster, Taimane Stimmel, MD Performed: anesthesiologist  Preanesthetic Checklist Completed: patient identified, site marked, surgical consent, pre-op evaluation, timeout performed, IV checked, risks and benefits discussed and monitors and equipment checked Spinal Block Patient position: sitting Prep: site prepped and draped and DuraPrep Patient monitoring: heart rate, cardiac monitor, continuous pulse ox and blood pressure Approach: midline Location: L3-4 Injection technique: single-shot Needle Needle type: Sprotte  Needle gauge: 24 G Needle length: 9 cm Assessment Sensory level: T4 Additional Notes Patient tolerated procedure well. Adequate sensory level.

## 2017-09-29 NOTE — MAU Note (Signed)
Patient felt a gush of clear fluid at 0800.  Still coming now.  Felt 1 kick since then.  Denies vaginal bleeding.

## 2017-09-29 NOTE — MAU Note (Signed)
Rolm BookbinderLisa B (OR Coordinator) notified of pending c/s- Dr Malen GauzeFoster with [facility]- she said she would notify him.  [facility]or[facility] made aware.  Fetal tach and fluctuating maternal pulse also discussed.

## 2017-09-29 NOTE — Anesthesia Preprocedure Evaluation (Signed)
Anesthesia Evaluation  Patient identified by MRN, date of birth, ID band Patient awake    Reviewed: Allergy & Precautions, NPO status , Patient's Chart, lab work & pertinent test results  Airway Mallampati: I  TM Distance: >3 FB Neck ROM: Full   Comment: Hypertrophied tonsils Dental  (+) Teeth Intact   Pulmonary neg pulmonary ROS,    Pulmonary exam normal breath sounds clear to auscultation       Cardiovascular negative cardio ROS Normal cardiovascular exam Rhythm:Regular Rate:Normal     Neuro/Psych negative neurological ROS  negative psych ROS   GI/Hepatic Neg liver ROS, GERD  Medicated and Controlled,  Endo/Other  Morbid obesity  Renal/GU negative Renal ROS  negative genitourinary   Musculoskeletal negative musculoskeletal ROS (+)   Abdominal (+) + obese,   Peds  Hematology  (+) anemia ,   Anesthesia Other Findings   Reproductive/Obstetrics (+) Pregnancy Previous C/Section - fetal macrosomia SROM                             Anesthesia Physical Anesthesia Plan  ASA: III  Anesthesia Plan: Spinal   Post-op Pain Management:    Induction:   PONV Risk Score and Plan: 4 or greater and Scopolamine patch - Pre-op, Ondansetron, Dexamethasone and Treatment may vary due to age or medical condition  Airway Management Planned: Natural Airway  Additional Equipment:   Intra-op Plan:   Post-operative Plan:   Informed Consent: I have reviewed the patients History and Physical, chart, labs and discussed the procedure including the risks, benefits and alternatives for the proposed anesthesia with the patient or authorized representative who has indicated his/her understanding and acceptance.   Dental advisory given  Plan Discussed with: CRNA and Surgeon  Anesthesia Plan Comments:         Anesthesia Quick Evaluation

## 2017-09-29 NOTE — Anesthesia Postprocedure Evaluation (Signed)
Anesthesia Post Note  Patient: Megan Barton  Procedure(s) Performed: REPEAT CESAREAN SECTION (N/A )     Patient location during evaluation: PACU Anesthesia Type: Spinal Level of consciousness: oriented and awake and alert Pain management: pain level controlled Vital Signs Assessment: post-procedure vital signs reviewed and stable Respiratory status: spontaneous breathing, respiratory function stable and nonlabored ventilation Cardiovascular status: blood pressure returned to baseline and stable Postop Assessment: no headache, no backache, no apparent nausea or vomiting and spinal receding Anesthetic complications: no    Last Vitals:  Vitals:   09/29/17 1315 09/29/17 1330  BP: 104/60 (!) 108/59  Pulse: 74 67  Resp: 20 19  Temp:    SpO2: 96% 98%    Last Pain:  Vitals:   09/29/17 1250  TempSrc: Oral   Pain Goal:                 Michaelene Dutan A.

## 2017-09-29 NOTE — H&P (Signed)
Megan ReedsMacey R Barton is a 27 y.o. female presenting for PROM at 8am today.  Patient has h/o previous C/S and desires repeat.  Antepartum course uncomplicated.  GBS negative.  Last po 9 pm last night.  OB History    Gravida  2   Para  1   Term  1   Preterm  0   AB  0   Living  1     SAB  0   TAB  0   Ectopic  0   Multiple  0   Live Births  1          Past Medical History:  Diagnosis Date  . Anemia   . Heartburn in pregnancy    zantac  . Obesity 01/06/2013  . Patient desires pregnancy 11/06/2012   resolved  . UTI (lower urinary tract infection) 01/06/2013   resolved   Past Surgical History:  Procedure Laterality Date  . CESAREAN SECTION N/A 02/10/2014   Procedure: CESAREAN SECTION;  Surgeon: Mitchel HonourMegan Elanda Garmany, DO;  Location: [facility];  Service: Obstetrics;  Laterality: N/A;  Primary edc 02/09/14  . WISDOM TOOTH EXTRACTION     Family History: family history includes Asthma in her brother; Cancer in her paternal aunt and paternal grandfather; Depression in her mother; Diabetes in her mother; Hyperlipidemia in her father; Hypertension in her father. Social History:  reports that she has never smoked. She has never used smokeless tobacco. She reports that she does not drink alcohol or use drugs.     Maternal Diabetes: No Genetic Screening: Normal Maternal Ultrasounds/Referrals: Normal Fetal Ultrasounds or other Referrals:  None Maternal Substance Abuse:  No Significant Maternal Medications:  None Significant Maternal Lab Results:  Lab values include: Group B Strep negative Other Comments:  None  ROS Maternal Medical History:  Reason for admission: Rupture of membranes.   Contractions: Onset was 3-5 hours ago.   Frequency: rare.   Perceived severity is mild.    Fetal activity: Perceived fetal activity is normal.   Last perceived fetal movement was within the past hour.    Prenatal complications: no prenatal complications Prenatal Complications - Diabetes: none.      Blood pressure 132/89, pulse (!) 112, temperature 98.1 F (36.7 C), temperature source Oral, resp. rate 20, weight 295 lb 8 oz (134 kg), last menstrual period 09/03/2016, SpO2 97 %, unknown if currently breastfeeding. Maternal Exam:  Uterine Assessment: Contraction strength is mild.  Contraction frequency is rare.   Abdomen: Patient reports no abdominal tenderness. Surgical scars: low transverse.   Fundal height is S>D.   Estimated fetal weight is 8#8.       Physical Exam  Constitutional: She is oriented to person, place, and time. She appears well-developed and well-nourished.  GI: Soft. There is no rebound and no guarding.  Neurological: She is alert and oriented to person, place, and time.  Skin: Skin is warm and dry.  Psychiatric: She has a normal mood and affect. Her behavior is normal.    Prenatal labs: ABO, Rh: B/Positive/-- (12/05 0000) Antibody: Negative (12/05 0000) Rubella: Immune (12/05 0000) RPR: Nonreactive (12/05 0000)  HBsAg: Negative (12/05 0000)  HIV: Non-reactive (12/05 0000)  GBS:     Assessment/Plan: 26yo G 2P1001 at 38w with PROM and previous C/S, desires repeat -Rpt C/S -Patient has been counseled re: risk of bleeding, infection, scarring, and damage to surrounding structures.  She understands the 1% risk of uterine rupture in subsequent pregnancies.  All questions were answered and the patient  wishes to proceed.  Megan Barton 09/29/2017, 10:43 AM

## 2017-09-30 ENCOUNTER — Encounter (HOSPITAL_COMMUNITY): Payer: Self-pay | Admitting: Obstetrics & Gynecology

## 2017-09-30 LAB — CBC
HCT: 28.6 % — ABNORMAL LOW (ref 36.0–46.0)
Hemoglobin: 9.1 g/dL — ABNORMAL LOW (ref 12.0–15.0)
MCH: 26.4 pg (ref 26.0–34.0)
MCHC: 32.2 g/dL (ref 30.0–36.0)
MCV: 81.9 fL (ref 78.0–100.0)
Platelets: 238 10*3/uL (ref 150–400)
RBC: 3.49 MIL/uL — ABNORMAL LOW (ref 3.87–5.11)
RDW: 14.3 % (ref 11.5–15.5)
WBC: 12.7 10*3/uL — ABNORMAL HIGH (ref 4.0–10.5)

## 2017-09-30 LAB — RPR: RPR: NONREACTIVE

## 2017-09-30 NOTE — Progress Notes (Signed)
Patient doing well No complaints  BP 114/65 (BP Location: Left Arm)   Pulse 74   Temp 98 F (36.7 C) (Oral)   Resp 20   Wt 134 kg (295 lb 8 oz)   LMP 09/03/2016   SpO2 97%   Breastfeeding? Unknown   BMI 42.40 kg/m  Results for orders placed or performed during the hospital encounter of 09/29/17 (from the past 24 hour(s))  CBC     Status: None   Collection Time: 09/29/17 11:00 AM  Result Value Ref Range   WBC 10.5 4.0 - 10.5 K/uL   RBC 4.67 3.87 - 5.11 MIL/uL   Hemoglobin 12.2 12.0 - 15.0 g/dL   HCT 16.138.3 09.636.0 - 04.546.0 %   MCV 82.0 78.0 - 100.0 fL   MCH 26.1 26.0 - 34.0 pg   MCHC 31.9 30.0 - 36.0 g/dL   RDW 40.914.2 81.111.5 - 91.415.5 %   Platelets 280 150 - 400 K/uL  RPR     Status: None   Collection Time: 09/29/17 11:00 AM  Result Value Ref Range   RPR Ser Ql Non Reactive Non Reactive  Type and screen     Status: None   Collection Time: 09/29/17 11:00 AM  Result Value Ref Range   ABO/RH(D) B POS    Antibody Screen NEG    Sample Expiration      10/02/2017 Performed at [facility], [address] [place], [place] [address]   POCT fern test     Status: None   Collection Time: 09/29/17 11:03 AM  Result Value Ref Range   POCT Fern Test Positive = ruptured amniotic membanes   CBC     Status: Abnormal   Collection Time: 09/30/17  5:22 AM  Result Value Ref Range   WBC 12.7 (H) 4.0 - 10.5 K/uL   RBC 3.49 (L) 3.87 - 5.11 MIL/uL   Hemoglobin 9.1 (L) 12.0 - 15.0 g/dL   HCT 62.128.6 (L) 30.836.0 - 65.746.0 %   MCV 81.9 78.0 - 100.0 fL   MCH 26.4 26.0 - 34.0 pg   MCHC 32.2 30.0 - 36.0 g/dL   RDW 84.614.3 96.211.5 - 95.215.5 %   Platelets 238 150 - 400 K/uL   Abdomen is soft and non tender  POD # 1  Doing well Routine care circ later today

## 2017-09-30 NOTE — Addendum Note (Signed)
Addendum  created 09/30/17 0756 by Algis GreenhouseBurger, Abena Erdman A, CRNA   Sign clinical note

## 2017-09-30 NOTE — Anesthesia Postprocedure Evaluation (Signed)
Anesthesia Post Note  Patient: Megan Barton  Procedure(s) Performed: REPEAT CESAREAN SECTION (N/A )     Patient location during evaluation: Mother Baby Anesthesia Type: Spinal Level of consciousness: awake Pain management: satisfactory to patient Vital Signs Assessment: post-procedure vital signs reviewed and stable Respiratory status: spontaneous breathing Cardiovascular status: stable Anesthetic complications: no    Last Vitals:  Vitals:   09/30/17 0310 09/30/17 0748  BP: (!) 103/53 114/65  Pulse: 69 74  Resp: 18 20  Temp: 36.9 C 36.7 C  SpO2: 98% 97%    Last Pain:  Vitals:   09/30/17 0748  TempSrc: Oral  PainSc:    Pain Goal:                 Cephus ShellingBURGER,Ashleyanne Hemmingway

## 2017-10-01 ENCOUNTER — Other Ambulatory Visit: Payer: Self-pay

## 2017-10-01 LAB — BIRTH TISSUE RECOVERY COLLECTION (PLACENTA DONATION)

## 2017-10-01 NOTE — Lactation Note (Signed)
This note was copied from a baby's chart. Lactation Consultation Note  Patient Name: Boy Scarlette ArMacey Donnell Today's Date: 10/01/2017    Per Marylene LandAngela, RN, patient declines to see lactation.   Lurline HareRichey, Mal[facility]sa [facility] 10/01/2017, 12:44 PM

## 2017-10-01 NOTE — Progress Notes (Signed)
Subjective: Postpartum Day 2: Cesarean Delivery Patient reports tolerating PO, + flatus and no problems voiding.    Objective: Vital signs in last 24 hours: Temp:  [98 F (36.7 C)-99.2 F (37.3 C)] 98 F (36.7 C) (06/26 0500) Pulse Rate:  [60-91] 77 (06/26 0500) Resp:  [17-18] 18 (06/26 0500) BP: (102-126)/(56-65) 117/56 (06/26 0500) SpO2:  [97 %-99 %] 97 % (06/26 0500) Weight:  [295 lb 8 oz (134 kg)] 295 lb 8 oz (134 kg) (06/26 0700)  Physical Exam:  General: alert, cooperative, appears stated age and no distress Lochia: appropriate Uterine Fundus: firm Incision: healing well DVT Evaluation: No evidence of DVT seen on physical exam.  Recent Labs    09/29/17 1100 09/30/17 0522  HGB 12.2 9.1*  HCT 38.3 28.6*    Assessment/Plan: Status post Cesarean section. Doing well postoperatively.  Continue current care.  Emireth Cockerham C 10/01/2017, 9:42 AM

## 2017-10-02 ENCOUNTER — Encounter (HOSPITAL_COMMUNITY)
Admission: RE | Admit: 2017-10-02 | Discharge: 2017-10-02 | Disposition: A | Discharge status: Home / Self Care | Payer: Commercial Managed Care - PPO | Source: Ambulatory Visit | Attending: Obstetrics and Gynecology | Admitting: Obstetrics and Gynecology

## 2017-10-02 ENCOUNTER — Ambulatory Visit: Payer: Commercial Managed Care - PPO | Admitting: Cardiovascular Disease

## 2017-10-02 MED ORDER — OXYCODONE-ACETAMINOPHEN 5-325 MG PO TABS: 1.0000 | ORAL_TABLET | ORAL | 0 refills | Status: DC | PRN

## 2017-10-02 MED ORDER — IBUPROFEN 600 MG PO TABS: 600.0000 mg | ORAL_TABLET | Freq: Four times a day (QID) | ORAL | 0 refills | Status: DC

## 2017-10-02 NOTE — Discharge Summary (Signed)
Obstetric Discharge Summary Reason for Admission: rupture of membranes Prenatal Procedures: ultrasound Intrapartum Procedures: cesarean: low cervical, transverse Postpartum Procedures: none Complications-Operative and Postpartum: none Hemoglobin  Date Value Ref Range Status  09/30/2017 9.1 (L) 12.0 - 15.0 g/dL Final    Comment:    REPEATED TO VERIFY DELTA CHECK NOTED    HCT  Date Value Ref Range Status  09/30/2017 28.6 (L) 36.0 - 46.0 % Final    Physical Exam:  General: alert and cooperative Lochia: appropriate Uterine Fundus: firm Incision: healing well, no significant drainage DVT Evaluation: No evidence of DVT seen on physical exam.  Discharge Diagnoses: Term Pregnancy-delivered  Discharge Information: Date: 10/02/2017 Activity: pelvic rest Diet: routine Medications: PNV, Ibuprofen and Percocet Condition: stable Instructions: refer to practice specific booklet Discharge to: home Follow-up Information    [place], [facility] Schedule an appointment as soon as possible for a visit in 1 week(s).   Contact information: [address] [place] [address] [phone]           Newborn Data: Live born female  Birth Weight: 9 lb 7.2 oz (4285 g) APGAR: 5, 7  Newborn Delivery   Birth date/time:  09/29/2017 12:04:00 Delivery type:  C-Section, Vacuum Assisted Trial of labor:  No C-section categorization:  Repeat     Home with mother.  Zelphia CairoGretchen Rylie Knierim 10/02/2017, 9:18 AM

## 2017-10-03 ENCOUNTER — Inpatient Hospital Stay (HOSPITAL_COMMUNITY)
Admission: AD | Admit: 2017-10-03 | Payer: Commercial Managed Care - PPO | Source: Ambulatory Visit | Admitting: Obstetrics and Gynecology

## 2019-09-02 ENCOUNTER — Encounter (HOSPITAL_COMMUNITY): Payer: Self-pay | Admitting: Emergency Medicine

## 2019-09-02 ENCOUNTER — Other Ambulatory Visit: Payer: Self-pay

## 2019-09-02 ENCOUNTER — Emergency Department (HOSPITAL_COMMUNITY)
Admission: EM | Admit: 2019-09-02 | Discharge: 2019-09-03 | Disposition: A | Discharge status: Home / Self Care | Payer: Commercial Managed Care - PPO | Attending: Emergency Medicine | Admitting: Emergency Medicine

## 2019-09-02 DIAGNOSIS — Z203 Contact with and (suspected) exposure to rabies: Secondary | ICD-10-CM | POA: Insufficient documentation

## 2019-09-02 DIAGNOSIS — Z2914 Encounter for prophylactic rabies immune globin: Secondary | ICD-10-CM | POA: Insufficient documentation

## 2019-09-02 LAB — I-STAT BETA HCG BLOOD, ED (MC, WL, AP ONLY): I-stat hCG, quantitative: <5 m[IU]/mL (ref <5)

## 2019-09-02 MED ORDER — RABIES IMMUNE GLOBULIN 150 UNIT/ML IM INJ
20.0000 [IU]/kg | INJECTION | Freq: Once | INTRAMUSCULAR | Status: AC
  Administered 2019-09-03: 2475 [IU] via INTRAMUSCULAR
  Filled 2019-09-02: qty 16.5

## 2019-09-02 MED ORDER — RABIES VACCINE, PCEC IM SUSR
1.0000 mL | Freq: Once | INTRAMUSCULAR | Status: AC
  Administered 2019-09-03: 1 mL via INTRAMUSCULAR
  Filled 2019-09-02: qty 1

## 2019-09-02 NOTE — ED Triage Notes (Signed)
Patient was exposed to baby raccoon.  Mother handled animal, no incident

## 2019-09-03 NOTE — ED Provider Notes (Signed)
[facility] Provider Note   CSN: [id number] Arrival date & time: 09/02/19  2315     History Chief Complaint  Patient presents with  . Animal Bite    raccoon exposure    Megan Barton is a 29 y.o. female.  HPI Patient is a 29 year old female after an exposure to a baby raccoon 2 nights ago.  She and her sons handled the animal.  She felt the claws on her skin, but she does not think that the animal bit her.  Husband disposed of the animal and it is therefore not available for testing. They were unsure if they should come in for treatment until she spoke with  A doctor and a veterinarian and decided to come in to be safe. No prior rabies vaccines.    Past Medical History:  Diagnosis Date  . Anemia   . Heartburn in pregnancy    zantac  . Obesity 01/06/2013  . Patient desires pregnancy 11/06/2012   resolved  . UTI (lower urinary tract infection) 01/06/2013   resolved    Patient Active Problem List   Diagnosis Date Noted  . S/P cesarean section 02/10/2014  . Obesity in pregnancy, antepartum 07/21/2013  . Supervision of normal first pregnancy 06/22/2013    Past Surgical History:  Procedure Laterality Date  . CESAREAN SECTION N/A 02/10/2014   Procedure: CESAREAN SECTION;  Surgeon: Linda Hedges, DO;  Location: [facility];  Service: Obstetrics;  Laterality: N/A;  Primary edc 02/09/14  . CESAREAN SECTION N/A 09/29/2017   Procedure: REPEAT CESAREAN SECTION;  Surgeon: Linda Hedges, DO;  Location: [place];  Service: Obstetrics;  Laterality: N/A;  Repeat edc 7/5 allergy to Keflex need RNFA  . WISDOM TOOTH EXTRACTION       OB History    Gravida  2   Para  2   Term  2   Preterm  0   AB  0   Living  2     SAB  0   TAB  0   Ectopic  0   Multiple  0   Live Births  2           Family History  Problem Relation Age of Onset  . Diabetes Mother   . Depression Mother   . Hypertension Father   . Hyperlipidemia  Father   . Asthma Brother   . Cancer Paternal Aunt        breast  . Cancer Paternal Grandfather        colon, liver    Social History   Tobacco Use  . Smoking status: Never Smoker  . Smokeless tobacco: Never Used  Substance Use Topics  . Alcohol use: No  . Drug use: No    Home Medications Prior to Admission medications   Medication Sig Start Date End Date Taking? Authorizing Provider  ibuprofen (ADVIL,MOTRIN) 600 MG tablet Take 1 tablet (600 mg total) by mouth every 6 (six) hours. 10/02/17   Marylynn Pearson, MD  oxyCODONE-acetaminophen (PERCOCET/ROXICET) 5-325 MG tablet Take 1-2 tablets by mouth every 4 (four) hours as needed (pain scale 4-7). 10/02/17   Marylynn Pearson, MD  Prenatal Vit-Fe Fumarate-FA (PRENATAL MULTIVITAMIN) TABS tablet Take 1 tablet by mouth daily at 12 noon.    [provider]  ranitidine (ZANTAC) 150 MG tablet Take 150 mg by mouth 2 (two) times daily.    [provider]    Allergies    Keflex [cephalexin]  Review of Systems  Review of Systems  Constitutional: Negative for chills and fever.  Gastrointestinal: Negative for diarrhea and vomiting.  Musculoskeletal: Negative for joint swelling, neck pain and neck stiffness.  Skin: Negative for rash and wound.  Neurological: Negative for weakness and headaches.    Physical Exam Updated Vital Signs BP 121/85 (BP Location: Left Arm)   Pulse 88   Temp 99 F (37.2 C) (Oral)   Resp 16   Wt 122.5 kg   SpO2 100%   BMI 38.74 kg/m   Physical Exam Vitals and nursing note reviewed.  Constitutional:      Appearance: Normal appearance. She is not toxic-appearing.  HENT:     Head: Normocephalic and atraumatic.     Nose: Nose normal.     Mouth/Throat:     Mouth: Mucous membranes are moist.     Pharynx: Oropharynx is clear.  Eyes:     General: No scleral icterus.    Conjunctiva/sclera: Conjunctivae normal.  Cardiovascular:     Rate and Rhythm: Normal rate and regular rhythm.      Pulses: Normal pulses.     Heart sounds: Normal heart sounds.  Pulmonary:     Effort: Pulmonary effort is normal.     Breath sounds: Normal breath sounds.  Musculoskeletal:        General: No tenderness or signs of injury.     Cervical back: Normal range of motion. No rigidity.  Skin:    General: Skin is warm.     Capillary Refill: Capillary refill takes less than 2 seconds.     Findings: No lesion.  Neurological:     General: No focal deficit present.     Mental Status: She is alert and oriented to person, place, and time.     ED Results / Procedures / Treatments   Labs (all labs ordered are listed, but only abnormal results are displayed) Labs Reviewed  I-STAT BETA HCG BLOOD, ED ([facility], [facility], [facility] ONLY)    EKG None  Radiology No results found.  Procedures Procedures (including critical care time)  Medications Ordered in ED Medications  rabies immune globulin (HYPERAB/KEDRAB) injection 2,475 Units (has no administration in time range)  rabies vaccine (RABAVERT) injection 1 mL (has no administration in time range)    ED Course  I have reviewed the triage vital signs and the nursing notes.  Pertinent labs & imaging results that were available during my care of the patient were reviewed by me and considered in my medical decision making (see chart for details).    MDM Rules/Calculators/A&P                     Old female presenting with her sons for a non-bite exposure to a baby raccoon.  Animal is unavailable for testing.  Patient is currently asymptomatic.  She has no open wound but she did feel the animals claw scratch her.  Rabies immunoglobulin and first dose of vaccine ordered.  Patient tolerated these well.  She was provided with a schedule for dates on which she should follow-up for repeat vaccination.  She expressed understanding.  Final Clinical Impression(s) / ED Diagnoses Final diagnoses:  Need for post exposure prophylaxis for rabies    Rx / DC Orders ED  Discharge Orders    None     Vicki Mallet, MD 09/03/2019 0201    Vicki Mallet, MD 09/26/19 [phone]

## 2019-09-03 NOTE — Discharge Instructions (Addendum)
                                  RABIES VACCINE FOLLOW UP  Patient's Name: Megan Barton                     Original Order Date:09/02/2019  Medical Record Number: [id number]  ED Physician: Vicki Mallet, MD Primary Diagnosis: Rabies Exposure       PCP: [facility]  Patient Phone Number: (home) [phone] (home)    (cell)  Telephone Information:  Mobile [phone]    (work) There is no work phone number on file. Species of Animal:     You have been seen in the Emergency Department for a possible rabies exposure. It's very important you return for the additional vaccine doses.     DAY 0:  09/03/19       DAY 3:  09/06/19   DAY 7:  09/10/19  DAY 14:  09/17/19

## 2019-09-06 ENCOUNTER — Ambulatory Visit
Admission: EM | Admit: 2019-09-06 | Discharge: 2019-09-06 | Disposition: A | Discharge status: Home / Self Care | Payer: Commercial Managed Care - PPO | Attending: Emergency Medicine | Admitting: Emergency Medicine

## 2019-09-06 DIAGNOSIS — Z203 Contact with and (suspected) exposure to rabies: Secondary | ICD-10-CM

## 2019-09-06 DIAGNOSIS — Z23 Encounter for immunization: Secondary | ICD-10-CM

## 2019-09-06 MED ORDER — RABIES VACCINE, PCEC IM SUSR
1.0000 mL | Freq: Once | INTRAMUSCULAR | Status: AC
  Administered 2019-09-06: 1 mL via INTRAMUSCULAR

## 2019-09-06 NOTE — ED Triage Notes (Signed)
Pt here for 2nd rabies vaccines. Had questions about side effects but otherwise no concerns

## 2019-09-10 ENCOUNTER — Ambulatory Visit
Admission: EM | Admit: 2019-09-10 | Discharge: 2019-09-10 | Disposition: A | Discharge status: Home / Self Care | Payer: Commercial Managed Care - PPO | Attending: Emergency Medicine | Admitting: Emergency Medicine

## 2019-09-10 ENCOUNTER — Other Ambulatory Visit: Payer: Self-pay

## 2019-09-10 DIAGNOSIS — Z23 Encounter for immunization: Secondary | ICD-10-CM

## 2019-09-10 DIAGNOSIS — Z203 Contact with and (suspected) exposure to rabies: Secondary | ICD-10-CM | POA: Diagnosis not present

## 2019-09-10 MED ORDER — RABIES VACCINE, PCEC IM SUSR
1.0000 mL | Freq: Once | INTRAMUSCULAR | Status: AC
  Administered 2019-09-10: 1 mL via INTRAMUSCULAR

## 2019-09-17 ENCOUNTER — Ambulatory Visit
Admission: EM | Admit: 2019-09-17 | Discharge: 2019-09-17 | Disposition: A | Discharge status: Home / Self Care | Payer: Commercial Managed Care - PPO | Attending: Emergency Medicine | Admitting: Emergency Medicine

## 2019-09-17 DIAGNOSIS — Z23 Encounter for immunization: Secondary | ICD-10-CM | POA: Diagnosis not present

## 2019-09-17 DIAGNOSIS — Z203 Contact with and (suspected) exposure to rabies: Secondary | ICD-10-CM | POA: Diagnosis not present

## 2019-09-17 MED ORDER — RABIES VACCINE, PCEC IM SUSR
1.0000 mL | Freq: Once | INTRAMUSCULAR | Status: AC
  Administered 2019-09-17: 1 mL via INTRAMUSCULAR

## 2019-09-17 NOTE — ED Triage Notes (Signed)
Pt here for third rabies shot.

## 2019-10-12 LAB — OB RESULTS CONSOLE HEPATITIS B SURFACE ANTIGEN: Hepatitis B Surface Ag: NEGATIVE

## 2019-10-12 LAB — OB RESULTS CONSOLE RUBELLA ANTIBODY, IGM: Rubella: IMMUNE

## 2019-10-12 LAB — HEPATITIS C ANTIBODY: HCV Ab: NEGATIVE

## 2019-10-12 LAB — OB RESULTS CONSOLE HIV ANTIBODY (ROUTINE TESTING): HIV: NONREACTIVE

## 2020-04-17 LAB — OB RESULTS CONSOLE GBS: GBS: NEGATIVE

## 2020-04-26 ENCOUNTER — Other Ambulatory Visit: Payer: Self-pay | Admitting: Obstetrics & Gynecology

## 2020-04-26 DIAGNOSIS — O403XX0 Polyhydramnios, third trimester, not applicable or unspecified: Secondary | ICD-10-CM

## 2020-04-26 DIAGNOSIS — Z363 Encounter for antenatal screening for malformations: Secondary | ICD-10-CM

## 2020-04-27 ENCOUNTER — Encounter (HOSPITAL_COMMUNITY): Payer: Self-pay | Admitting: *Deleted

## 2020-04-27 ENCOUNTER — Telehealth (HOSPITAL_COMMUNITY): Payer: Self-pay | Admitting: *Deleted

## 2020-04-27 NOTE — Patient Instructions (Signed)
Skyelyn Scruggs [facility]  04/27/2020   Your procedure is scheduled on:  05/11/2020  Arrive at 0530 at [employer] [facility] on [address] at [facility]  and [employer]. You are invited to use the FREE valet parking or use the Visitor's parking deck.  Pick up the phone at the desk and dial [phone].  Call this number if you have problems the morning of surgery: [phone]  Remember:   Do not eat food:(After Midnight) Desps de medianoche.  Do not drink clear liquids: (After Midnight) Desps de medianoche.  Take these medicines the morning of surgery with A SIP OF WATER:  none   Do not wear jewelry, make-up or nail polish.  Do not wear lotions, powders, or perfumes. Do not wear deodorant.  Do not shave 48 hours prior to surgery.  Do not bring valuables to the hospital.  [facility] is not   responsible for any belongings or valuables brought to the hospital.  Contacts, dentures or bridgework may not be worn into surgery.  Leave suitcase in the car. After surgery it may be brought to your room.  For patients admitted to the hospital, checkout time is 11:00 AM the day of              discharge.      Please read over the following fact sheets that you were given:     Preparing for Surgery

## 2020-04-27 NOTE — Telephone Encounter (Signed)
Preadmission screen  

## 2020-04-28 ENCOUNTER — Telehealth (HOSPITAL_COMMUNITY): Payer: Self-pay | Admitting: *Deleted

## 2020-04-28 NOTE — Telephone Encounter (Signed)
Preadmission screen  

## 2020-05-02 ENCOUNTER — Ambulatory Visit: Payer: Commercial Managed Care - PPO | Admitting: *Deleted

## 2020-05-02 ENCOUNTER — Other Ambulatory Visit: Payer: Self-pay | Admitting: Obstetrics & Gynecology

## 2020-05-02 ENCOUNTER — Other Ambulatory Visit: Payer: Self-pay

## 2020-05-02 ENCOUNTER — Ambulatory Visit (HOSPITAL_BASED_OUTPATIENT_CLINIC_OR_DEPARTMENT_OTHER): Payer: Commercial Managed Care - PPO

## 2020-05-02 VITALS — BP 119/74 | HR 95

## 2020-05-02 DIAGNOSIS — O409XX0 Polyhydramnios, unspecified trimester, not applicable or unspecified: Secondary | ICD-10-CM | POA: Insufficient documentation

## 2020-05-02 DIAGNOSIS — O09293 Supervision of pregnancy with other poor reproductive or obstetric history, third trimester: Secondary | ICD-10-CM

## 2020-05-02 DIAGNOSIS — O403XX0 Polyhydramnios, third trimester, not applicable or unspecified: Secondary | ICD-10-CM | POA: Insufficient documentation

## 2020-05-02 DIAGNOSIS — Z3A37 37 weeks gestation of pregnancy: Secondary | ICD-10-CM

## 2020-05-02 DIAGNOSIS — E669 Obesity, unspecified: Secondary | ICD-10-CM | POA: Diagnosis not present

## 2020-05-02 DIAGNOSIS — O99213 Obesity complicating pregnancy, third trimester: Secondary | ICD-10-CM

## 2020-05-02 DIAGNOSIS — Z363 Encounter for antenatal screening for malformations: Secondary | ICD-10-CM | POA: Insufficient documentation

## 2020-05-02 DIAGNOSIS — O34219 Maternal care for unspecified type scar from previous cesarean delivery: Secondary | ICD-10-CM

## 2020-05-02 DIAGNOSIS — O3663X0 Maternal care for excessive fetal growth, third trimester, not applicable or unspecified: Secondary | ICD-10-CM

## 2020-05-02 DIAGNOSIS — O368130 Decreased fetal movements, third trimester, not applicable or unspecified: Secondary | ICD-10-CM

## 2020-05-02 DIAGNOSIS — O26843 Uterine size-date discrepancy, third trimester: Secondary | ICD-10-CM

## 2020-05-03 ENCOUNTER — Inpatient Hospital Stay (HOSPITAL_COMMUNITY)
Admission: RE | Admit: 2020-05-03 | Discharge: 2020-05-05 | DRG: 788 | Disposition: A | Discharge status: Home / Self Care | Payer: Commercial Managed Care - PPO | Attending: Obstetrics and Gynecology | Admitting: Obstetrics and Gynecology

## 2020-05-03 ENCOUNTER — Encounter (HOSPITAL_COMMUNITY)
Admission: RE | Disposition: A | Discharge status: Home / Self Care | Payer: Self-pay | Source: Home / Self Care | Attending: Obstetrics and Gynecology

## 2020-05-03 ENCOUNTER — Inpatient Hospital Stay (HOSPITAL_COMMUNITY): Payer: Commercial Managed Care - PPO | Admitting: Anesthesiology

## 2020-05-03 ENCOUNTER — Encounter (HOSPITAL_COMMUNITY): Payer: Self-pay | Admitting: Obstetrics & Gynecology

## 2020-05-03 DIAGNOSIS — O9081 Anemia of the puerperium: Secondary | ICD-10-CM | POA: Diagnosis not present

## 2020-05-03 DIAGNOSIS — O99214 Obesity complicating childbirth: Secondary | ICD-10-CM | POA: Diagnosis present

## 2020-05-03 DIAGNOSIS — O99893 Other specified diseases and conditions complicating puerperium: Secondary | ICD-10-CM | POA: Diagnosis not present

## 2020-05-03 DIAGNOSIS — O403XX0 Polyhydramnios, third trimester, not applicable or unspecified: Secondary | ICD-10-CM | POA: Diagnosis present

## 2020-05-03 DIAGNOSIS — Z20822 Contact with and (suspected) exposure to covid-19: Secondary | ICD-10-CM | POA: Diagnosis present

## 2020-05-03 DIAGNOSIS — O3663X0 Maternal care for excessive fetal growth, third trimester, not applicable or unspecified: Secondary | ICD-10-CM | POA: Diagnosis present

## 2020-05-03 DIAGNOSIS — Z3A38 38 weeks gestation of pregnancy: Secondary | ICD-10-CM

## 2020-05-03 DIAGNOSIS — O34211 Maternal care for low transverse scar from previous cesarean delivery: Principal | ICD-10-CM | POA: Diagnosis present

## 2020-05-03 DIAGNOSIS — I9581 Postprocedural hypotension: Secondary | ICD-10-CM | POA: Diagnosis not present

## 2020-05-03 DIAGNOSIS — Z98891 History of uterine scar from previous surgery: Secondary | ICD-10-CM

## 2020-05-03 LAB — CBC
HCT: 37.8 % (ref 36.0–46.0)
Hemoglobin: 11.3 g/dL — ABNORMAL LOW (ref 12.0–15.0)
MCH: 25.3 pg — ABNORMAL LOW (ref 26.0–34.0)
MCHC: 29.9 g/dL — ABNORMAL LOW (ref 30.0–36.0)
MCV: 84.8 fL (ref 80.0–100.0)
Platelets: 248 10*3/uL (ref 150–400)
RBC: 4.46 MIL/uL (ref 3.87–5.11)
RDW: 13.9 % (ref 11.5–15.5)
WBC: 9.1 10*3/uL (ref 4.0–10.5)
nRBC: 0 % (ref 0.0–0.2)

## 2020-05-03 LAB — TYPE AND SCREEN
ABO/RH(D): B POS
Antibody Screen: NEGATIVE

## 2020-05-03 LAB — SARS CORONAVIRUS 2 BY RT PCR (HOSPITAL ORDER, PERFORMED IN ~~LOC~~ HOSPITAL LAB): SARS Coronavirus 2: NEGATIVE

## 2020-05-03 LAB — RPR: RPR Ser Ql: NONREACTIVE

## 2020-05-03 SURGERY — Surgical Case
Anesthesia: Spinal | Site: Abdomen | Wound class: Clean Contaminated

## 2020-05-03 MED ORDER — COCONUT OIL OIL: 1.0000 "application " | TOPICAL_OIL | Status: DC | PRN

## 2020-05-03 MED ORDER — SENNOSIDES-DOCUSATE SODIUM 8.6-50 MG PO TABS
2.0000 | ORAL_TABLET | ORAL | Status: DC
  Administered 2020-05-04: 2 via ORAL
  Filled 2020-05-03: qty 2

## 2020-05-03 MED ORDER — DEXAMETHASONE SODIUM PHOSPHATE 10 MG/ML IJ SOLN
INTRAMUSCULAR | Status: AC
  Filled 2020-05-03: qty 1

## 2020-05-03 MED ORDER — PROMETHAZINE HCL 25 MG/ML IJ SOLN: 6.2500 mg | INTRAMUSCULAR | Status: DC | PRN

## 2020-05-03 MED ORDER — ACETAMINOPHEN 325 MG PO TABS
650.0000 mg | ORAL_TABLET | ORAL | Status: DC | PRN
  Administered 2020-05-04 – 2020-05-05 (×2): 650 mg via ORAL
  Filled 2020-05-03 (×2): qty 2

## 2020-05-03 MED ORDER — OXYTOCIN-SODIUM CHLORIDE 30-0.9 UT/500ML-% IV SOLN: 2.5000 [IU]/h | INTRAVENOUS | Status: AC

## 2020-05-03 MED ORDER — OXYTOCIN-SODIUM CHLORIDE 30-0.9 UT/500ML-% IV SOLN
INTRAVENOUS | Status: DC | PRN
  Administered 2020-05-03: 300 mL via INTRAVENOUS

## 2020-05-03 MED ORDER — OXYTOCIN-SODIUM CHLORIDE 30-0.9 UT/500ML-% IV SOLN
INTRAVENOUS | Status: AC
  Filled 2020-05-03: qty 500

## 2020-05-03 MED ORDER — DIPHENHYDRAMINE HCL 25 MG PO CAPS: 25.0000 mg | ORAL_CAPSULE | ORAL | Status: DC | PRN

## 2020-05-03 MED ORDER — WITCH HAZEL-GLYCERIN EX PADS: 1.0000 "application " | MEDICATED_PAD | CUTANEOUS | Status: DC | PRN

## 2020-05-03 MED ORDER — PRENATAL MULTIVITAMIN CH
1.0000 | ORAL_TABLET | Freq: Every day | ORAL | Status: DC
  Administered 2020-05-04: 1 via ORAL
  Filled 2020-05-03: qty 1

## 2020-05-03 MED ORDER — STERILE WATER FOR IRRIGATION IR SOLN
Status: DC | PRN
  Administered 2020-05-03: 1

## 2020-05-03 MED ORDER — ONDANSETRON HCL 4 MG/2ML IJ SOLN
INTRAMUSCULAR | Status: DC | PRN
  Administered 2020-05-03: 4 mg via INTRAVENOUS

## 2020-05-03 MED ORDER — MEASLES, MUMPS & RUBELLA VAC IJ SOLR: 0.5000 mL | Freq: Once | INTRAMUSCULAR | Status: DC

## 2020-05-03 MED ORDER — GENTAMICIN SULFATE 40 MG/ML IJ SOLN
5.0000 mg/kg | INTRAVENOUS | Status: AC
  Administered 2020-05-03: 479.6 mg via INTRAVENOUS
  Filled 2020-05-03: qty 12

## 2020-05-03 MED ORDER — KETOROLAC TROMETHAMINE 30 MG/ML IJ SOLN
30.0000 mg | Freq: Four times a day (QID) | INTRAMUSCULAR | Status: AC | PRN
  Administered 2020-05-03: 30 mg via INTRAVENOUS
  Filled 2020-05-03: qty 1

## 2020-05-03 MED ORDER — MORPHINE SULFATE (PF) 0.5 MG/ML IJ SOLN
INTRAMUSCULAR | Status: AC
  Filled 2020-05-03: qty 10

## 2020-05-03 MED ORDER — MENTHOL 3 MG MT LOZG: 1.0000 | LOZENGE | OROMUCOSAL | Status: DC | PRN

## 2020-05-03 MED ORDER — FENTANYL CITRATE (PF) 100 MCG/2ML IJ SOLN
INTRAMUSCULAR | Status: DC | PRN
  Administered 2020-05-03: 15 ug via INTRATHECAL

## 2020-05-03 MED ORDER — SCOPOLAMINE 1 MG/3DAYS TD PT72
MEDICATED_PATCH | TRANSDERMAL | Status: AC
  Filled 2020-05-03: qty 1

## 2020-05-03 MED ORDER — OXYCODONE-ACETAMINOPHEN 5-325 MG PO TABS
1.0000 | ORAL_TABLET | ORAL | Status: DC | PRN
  Administered 2020-05-05: 1 via ORAL
  Filled 2020-05-03: qty 1

## 2020-05-03 MED ORDER — NALOXONE HCL 4 MG/10ML IJ SOLN
1.0000 ug/kg/h | INTRAMUSCULAR | Status: DC | PRN
  Filled 2020-05-03: qty 5

## 2020-05-03 MED ORDER — PHENYLEPHRINE HCL-NACL 20-0.9 MG/250ML-% IV SOLN
INTRAVENOUS | Status: DC | PRN
  Administered 2020-05-03: 60 ug/min via INTRAVENOUS

## 2020-05-03 MED ORDER — BUPIVACAINE IN DEXTROSE 0.75-8.25 % IT SOLN
INTRATHECAL | Status: DC | PRN
  Administered 2020-05-03: 1.4 mg via INTRATHECAL

## 2020-05-03 MED ORDER — SIMETHICONE 80 MG PO CHEW
80.0000 mg | CHEWABLE_TABLET | ORAL | Status: DC | PRN
  Administered 2020-05-05: 80 mg via ORAL

## 2020-05-03 MED ORDER — LACTATED RINGERS IV BOLUS
500.0000 mL | Freq: Once | INTRAVENOUS | Status: AC
  Administered 2020-05-03: 500 mL via INTRAVENOUS

## 2020-05-03 MED ORDER — ONDANSETRON HCL 4 MG/2ML IJ SOLN: 4.0000 mg | Freq: Three times a day (TID) | INTRAMUSCULAR | Status: DC | PRN

## 2020-05-03 MED ORDER — ZOLPIDEM TARTRATE 5 MG PO TABS: 5.0000 mg | ORAL_TABLET | Freq: Every evening | ORAL | Status: DC | PRN

## 2020-05-03 MED ORDER — TETANUS-DIPHTH-ACELL PERTUSSIS 5-2.5-18.5 LF-MCG/0.5 IM SUSY: 0.5000 mL | PREFILLED_SYRINGE | Freq: Once | INTRAMUSCULAR | Status: DC

## 2020-05-03 MED ORDER — POVIDONE-IODINE 10 % EX SWAB: 2.0000 "application " | Freq: Once | CUTANEOUS | Status: DC

## 2020-05-03 MED ORDER — SCOPOLAMINE 1 MG/3DAYS TD PT72: 1.0000 | MEDICATED_PATCH | Freq: Once | TRANSDERMAL | Status: DC

## 2020-05-03 MED ORDER — ACETAMINOPHEN 500 MG PO TABS
1000.0000 mg | ORAL_TABLET | Freq: Four times a day (QID) | ORAL | Status: AC
  Administered 2020-05-04 (×2): 1000 mg via ORAL
  Filled 2020-05-03 (×2): qty 2

## 2020-05-03 MED ORDER — MEPERIDINE HCL 25 MG/ML IJ SOLN: 6.2500 mg | INTRAMUSCULAR | Status: DC | PRN

## 2020-05-03 MED ORDER — DIPHENHYDRAMINE HCL 25 MG PO CAPS: 25.0000 mg | ORAL_CAPSULE | Freq: Four times a day (QID) | ORAL | Status: DC | PRN

## 2020-05-03 MED ORDER — CLINDAMYCIN PHOSPHATE 900 MG/50ML IV SOLN
INTRAVENOUS | Status: AC
  Filled 2020-05-03: qty 50

## 2020-05-03 MED ORDER — CLINDAMYCIN PHOSPHATE 900 MG/50ML IV SOLN
INTRAVENOUS | Status: DC | PRN
  Administered 2020-05-03: 900 mg via INTRAVENOUS

## 2020-05-03 MED ORDER — SIMETHICONE 80 MG PO CHEW
80.0000 mg | CHEWABLE_TABLET | Freq: Three times a day (TID) | ORAL | Status: DC
  Administered 2020-05-04 (×3): 80 mg via ORAL
  Filled 2020-05-03 (×4): qty 1

## 2020-05-03 MED ORDER — NALBUPHINE HCL 10 MG/ML IJ SOLN
5.0000 mg | Freq: Once | INTRAMUSCULAR | Status: DC | PRN
Start: 2020-05-03 — End: 2020-05-05

## 2020-05-03 MED ORDER — LACTATED RINGERS IV SOLN: INTRAVENOUS | Status: DC

## 2020-05-03 MED ORDER — NALBUPHINE HCL 10 MG/ML IJ SOLN: 5.0000 mg | INTRAMUSCULAR | Status: DC | PRN

## 2020-05-03 MED ORDER — PHENYLEPHRINE HCL-NACL 20-0.9 MG/250ML-% IV SOLN
INTRAVENOUS | Status: AC
  Filled 2020-05-03: qty 250

## 2020-05-03 MED ORDER — SCOPOLAMINE 1 MG/3DAYS TD PT72
MEDICATED_PATCH | TRANSDERMAL | Status: DC | PRN
  Administered 2020-05-03: 1 via TRANSDERMAL

## 2020-05-03 MED ORDER — KETOROLAC TROMETHAMINE 30 MG/ML IJ SOLN
30.0000 mg | Freq: Once | INTRAMUSCULAR | Status: AC | PRN
  Administered 2020-05-03: 30 mg via INTRAVENOUS

## 2020-05-03 MED ORDER — LACTATED RINGERS IV SOLN
INTRAVENOUS | Status: DC
  Administered 2020-05-03: 500 mL via INTRAVENOUS

## 2020-05-03 MED ORDER — DIPHENHYDRAMINE HCL 50 MG/ML IJ SOLN: 12.5000 mg | INTRAMUSCULAR | Status: DC | PRN

## 2020-05-03 MED ORDER — ONDANSETRON HCL 4 MG/2ML IJ SOLN
INTRAMUSCULAR | Status: AC
  Filled 2020-05-03: qty 2

## 2020-05-03 MED ORDER — MORPHINE SULFATE (PF) 0.5 MG/ML IJ SOLN
INTRAMUSCULAR | Status: DC | PRN
  Administered 2020-05-03: .15 mg via INTRATHECAL

## 2020-05-03 MED ORDER — HYDROMORPHONE HCL 1 MG/ML IJ SOLN: 0.2500 mg | INTRAMUSCULAR | Status: DC | PRN

## 2020-05-03 MED ORDER — CLINDAMYCIN PHOSPHATE 900 MG/50ML IV SOLN: 900.0000 mg | INTRAVENOUS | Status: DC

## 2020-05-03 MED ORDER — FENTANYL CITRATE (PF) 100 MCG/2ML IJ SOLN
INTRAMUSCULAR | Status: AC
  Filled 2020-05-03: qty 2

## 2020-05-03 MED ORDER — KETOROLAC TROMETHAMINE 30 MG/ML IJ SOLN: 30.0000 mg | Freq: Four times a day (QID) | INTRAMUSCULAR | Status: AC | PRN

## 2020-05-03 MED ORDER — DEXAMETHASONE SODIUM PHOSPHATE 10 MG/ML IJ SOLN
INTRAMUSCULAR | Status: DC | PRN
  Administered 2020-05-03: 10 mg via INTRAVENOUS

## 2020-05-03 MED ORDER — KETOROLAC TROMETHAMINE 30 MG/ML IJ SOLN
INTRAMUSCULAR | Status: AC
  Filled 2020-05-03: qty 1

## 2020-05-03 MED ORDER — MEPERIDINE HCL 25 MG/ML IJ SOLN
6.2500 mg | INTRAMUSCULAR | Status: DC | PRN
Start: 2020-05-03 — End: 2020-05-03

## 2020-05-03 MED ORDER — GENTAMICIN SULFATE 40 MG/ML IJ SOLN: 5.0000 mg/kg | INTRAVENOUS | Status: DC

## 2020-05-03 MED ORDER — DIBUCAINE (PERIANAL) 1 % EX OINT: 1.0000 "application " | TOPICAL_OINTMENT | CUTANEOUS | Status: DC | PRN

## 2020-05-03 MED ORDER — NALOXONE HCL 0.4 MG/ML IJ SOLN: 0.4000 mg | INTRAMUSCULAR | Status: DC | PRN

## 2020-05-03 MED ORDER — IBUPROFEN 600 MG PO TABS
600.0000 mg | ORAL_TABLET | Freq: Four times a day (QID) | ORAL | Status: DC | PRN
  Administered 2020-05-04 – 2020-05-05 (×5): 600 mg via ORAL
  Filled 2020-05-03 (×5): qty 1

## 2020-05-03 MED ORDER — NALBUPHINE HCL 10 MG/ML IJ SOLN: 5.0000 mg | Freq: Once | INTRAMUSCULAR | Status: DC | PRN

## 2020-05-03 MED ORDER — SODIUM CHLORIDE 0.9% FLUSH: 3.0000 mL | INTRAVENOUS | Status: DC | PRN

## 2020-05-03 SURGICAL SUPPLY — 35 items
CLAMP CORD UMBIL (MISCELLANEOUS) IMPLANT
CLIP FILSHIE TUBAL LIGA STRL (Clip) IMPLANT
CLOTH BEACON ORANGE TIMEOUT ST (SAFETY) ×3 IMPLANT
DRSG OPSITE POSTOP 4X10 (GAUZE/BANDAGES/DRESSINGS) ×3 IMPLANT
ELECT REM PT RETURN 9FT ADLT (ELECTROSURGICAL) ×3
ELECTRODE REM PT RTRN 9FT ADLT (ELECTROSURGICAL) ×2 IMPLANT
EXTRACTOR VACUUM M CUP 4 TUBE (SUCTIONS) IMPLANT
GLOVE BIOGEL PI IND STRL 7.0 (GLOVE) ×2 IMPLANT
GLOVE BIOGEL PI INDICATOR 7.0 (GLOVE) ×1
GLOVE SURG ORTHO 8.0 STRL STRW (GLOVE) ×3 IMPLANT
GOWN STRL REUS W/TWL LRG LVL3 (GOWN DISPOSABLE) ×6 IMPLANT
KIT ABG SYR 3ML LUER SLIP (SYRINGE) ×3 IMPLANT
MAT PREVALON FULL STRYKER (MISCELLANEOUS) ×2 IMPLANT
NDL HYPO 25X5/8 SAFETYGLIDE (NEEDLE) ×1 IMPLANT
NEEDLE HYPO 25X5/8 SAFETYGLIDE (NEEDLE) ×3 IMPLANT
NS IRRIG 1000ML POUR BTL (IV SOLUTION) ×3 IMPLANT
PACK C SECTION WH (CUSTOM PROCEDURE TRAY) ×3 IMPLANT
PAD OB MATERNITY 4.3X12.25 (PERSONAL CARE ITEMS) ×3 IMPLANT
PENCIL SMOKE EVAC W/HOLSTER (ELECTROSURGICAL) ×3 IMPLANT
RETRACTOR TRAXI PANNICULUS (MISCELLANEOUS) ×1 IMPLANT
RETRACTOR WND ALEXIS 25 LRG (MISCELLANEOUS) ×1 IMPLANT
RTRCTR WOUND ALEXIS 25CM LRG (MISCELLANEOUS) ×3
SUT MNCRL 0 VIOLET CTX 36 (SUTURE) ×6 IMPLANT
SUT MON AB 4-0 PS1 27 (SUTURE) ×3 IMPLANT
SUT MONOCRYL 0 CTX 36 (SUTURE) ×9
SUT PDS AB 0 CTX 60 (SUTURE) ×2 IMPLANT
SUT PDS AB 1 CT  36 (SUTURE)
SUT PDS AB 1 CT 36 (SUTURE) IMPLANT
SUT PLAIN 2 0 XLH (SUTURE) ×2 IMPLANT
SUT VIC AB 1 CTX 36 (SUTURE)
SUT VIC AB 1 CTX36XBRD ANBCTRL (SUTURE) IMPLANT
TOWEL OR 17X24 6PK STRL BLUE (TOWEL DISPOSABLE) ×3 IMPLANT
TRAXI PANNICULUS RETRACTOR (MISCELLANEOUS) ×1
TRAY FOLEY W/BAG SLVR 14FR LF (SET/KITS/TRAYS/PACK) ×3 IMPLANT
WATER STERILE IRR 1000ML POUR (IV SOLUTION) ×3 IMPLANT

## 2020-05-03 NOTE — H&P (Signed)
Megan Barton is a 30 y.o. female presenting for repeat c/s.  LGA with 10+10# and polyhydraminos by MFM [place] yesterday.  Dr Grace Bushy recommend delivery at 38 weeks.  Pregnancy otherwise uncomplicated. Hx of uterine thinning v window at last c/s.  Megan Barton wants BTL if uterine window today.. OB History    Gravida  3   Para  2   Term  2   Preterm  0   AB  0   Living  2     SAB  0   IAB  0   Ectopic  0   Multiple  0   Live Births  2          Past Medical History:  Diagnosis Date  . Anemia   . Heartburn in pregnancy    zantac  . Obesity 01/06/2013  . Patient desires pregnancy 11/06/2012   resolved  . UTI (lower urinary tract infection) 01/06/2013   resolved   Past Surgical History:  Procedure Laterality Date  . CESAREAN SECTION N/A 02/10/2014   Procedure: CESAREAN SECTION;  Surgeon: Mitchel Honour, DO;  Location: [facility];  Service: Obstetrics;  Laterality: N/A;  Primary edc 02/09/14  . CESAREAN SECTION N/A 09/29/2017   Procedure: REPEAT CESAREAN SECTION;  Surgeon: Mitchel Honour, DO;  Location: [facility];  Service: Obstetrics;  Laterality: N/A;  Repeat edc 7/5 allergy to Keflex need RNFA  . WISDOM TOOTH EXTRACTION     Family History: family history includes Asthma in her brother; Cancer in her paternal aunt and paternal grandfather; Depression in her mother; Diabetes in her mother; Hyperlipidemia in her father; Hypertension in her father. Social History:  reports that she has never smoked. She has never used smokeless tobacco. She reports that she does not drink alcohol and does not use drugs.     Maternal Diabetes: No Genetic Screening: Normal Maternal Ultrasounds/Referrals: Other: Fetal Ultrasounds or other Referrals:  Referred to Materal Fetal Medicine  Maternal Substance Abuse:  No Significant Maternal Medications:  None Significant Maternal Lab Results:  Group B Strep negative Other Comments:  None  Review of Systems History   Blood pressure 138/86,  pulse (!) 104, temperature 98.2 F (36.8 C), temperature source Oral, resp. rate 20, height 5\' 10"  (1.778 m), weight (!) 137 kg, last menstrual period 08/15/2019, SpO2 100 %, unknown if currently breastfeeding. Exam Physical Exam  Prenatal labs: ABO, Rh: --/--/B POS (01/26 12-03-1981) Antibody: NEG (01/26 0729) Rubella:   RPR:    HBsAg:    HIV:    GBS:     Assessment/Plan: IUP at 38 weeks Previous c/s for repeat Hx of uterine window.  At risk for hysterectomy and desires BTL if uterine window again. Discussed at length Polyhydraminos and LGA.  MFM rec 38 week delivery Risks and benefits of C/S were discussed.  All questions were answered and informed consent was obtained.  Plan to proceed with low segment transverse Cesarean Section.   12-03-1981 05/03/2020, 10:01 AM

## 2020-05-03 NOTE — Transfer of Care (Signed)
Immediate Anesthesia Transfer of Care Note  Patient: Megan Barton  Procedure(s) Performed: CESAREAN SECTION (N/A Abdomen)  Patient Location: PACU  Anesthesia Type:Spinal  Level of Consciousness: awake  Airway & Oxygen Therapy: Patient Spontanous Breathing  Post-op Assessment: Report given to RN  Post vital signs: Reviewed and stable  Last Vitals:  Vitals Value Taken Time  BP    Temp    Pulse    Resp    SpO2      Last Pain:  Vitals:   05/03/20 0748  TempSrc: Oral  PainSc: 0-No pain         Complications: No complications documented.

## 2020-05-03 NOTE — Anesthesia Postprocedure Evaluation (Signed)
Anesthesia Post Note  Patient: Harlea R Mumpower  Procedure(s) Performed: CESAREAN SECTION (N/A Abdomen)     Patient location during evaluation: PACU Anesthesia Type: Spinal Level of consciousness: awake Pain management: pain level controlled Vital Signs Assessment: post-procedure vital signs reviewed and stable Respiratory status: spontaneous breathing Cardiovascular status: stable Postop Assessment: no headache, no backache, spinal receding, patient able to bend at knees and no apparent nausea or vomiting Anesthetic complications: no   No complications documented.  Last Vitals:  Vitals:   05/03/20 1300 05/03/20 1330  BP:  103/61  Pulse: 73 70  Resp: (!) 27 20  Temp:  37.1 C  SpO2: 98% 98%    Last Pain:  Vitals:   05/03/20 1330  TempSrc: Oral  PainSc:    Pain Goal:    LLE Motor Response: Purposeful movement (05/03/20 1300) LLE Sensation: Tingling (05/03/20 1300) RLE Motor Response: Purposeful movement (05/03/20 1300) RLE Sensation: Tingling (05/03/20 1300)     Epidural/Spinal Function Cutaneous sensation: Tingles (05/03/20 1300), Patient able to flex knees: Yes (05/03/20 1300), Patient able to lift hips off bed: No (05/03/20 1300), Back pain beyond tenderness at insertion site: No (05/03/20 1300), Progressively worsening motor and/or sensory loss: No (05/03/20 1300), Bowel and/or bladder incontinence post epidural: No (05/03/20 1300)  Caren Macadam

## 2020-05-03 NOTE — Anesthesia Procedure Notes (Signed)
Spinal  Patient location during procedure: OR Start time: 05/03/2020 10:05 AM End time: 05/03/2020 10:09 AM Staffing Performed: anesthesiologist  Anesthesiologist: Leilani Able, MD Preanesthetic Checklist Completed: patient identified, IV checked, site marked, risks and benefits discussed, surgical consent, monitors and equipment checked, pre-op evaluation and timeout performed Spinal Block Patient position: sitting Prep: DuraPrep and site prepped and draped Patient monitoring: continuous pulse ox and blood pressure Approach: midline Location: L3-4 Injection technique: single-shot Needle Needle type: Pencan  Needle gauge: 24 G Needle length: 10 cm Needle insertion depth: 6 cm Assessment Sensory level: T4

## 2020-05-03 NOTE — Anesthesia Preprocedure Evaluation (Signed)
Anesthesia Evaluation  Patient identified by MRN, date of birth, ID band Patient awake    Reviewed: Allergy & Precautions, NPO status , Patient's Chart, lab work & pertinent test results  Airway Mallampati: I      Comment: Hypertrophied tonsils Dental  (+) Teeth Intact   Pulmonary neg pulmonary ROS,    Pulmonary exam normal        Cardiovascular negative cardio ROS Normal cardiovascular exam     Neuro/Psych negative neurological ROS  negative psych ROS   GI/Hepatic Neg liver ROS, GERD  Medicated and Controlled,  Endo/Other  Morbid obesity  Renal/GU negative Renal ROS  negative genitourinary   Musculoskeletal negative musculoskeletal ROS (+)   Abdominal (+) + obese,   Peds  Hematology   Anesthesia Other Findings   Reproductive/Obstetrics (+) Pregnancy Previous C/Section - fetal macrosomia SROM                             Anesthesia Physical  Anesthesia Plan  ASA: III  Anesthesia Plan: Spinal   Post-op Pain Management:    Induction:   PONV Risk Score and Plan: 3 and Scopolamine patch - Pre-op, Ondansetron, Dexamethasone and Treatment may vary due to age or medical condition  Airway Management Planned: Natural Airway and Nasal Cannula  Additional Equipment: None  Intra-op Plan:   Post-operative Plan:   Informed Consent: I have reviewed the patients History and Physical, chart, labs and discussed the procedure including the risks, benefits and alternatives for the proposed anesthesia with the patient or authorized representative who has indicated his/her understanding and acceptance.     Dental advisory given  Plan Discussed with: CRNA  Anesthesia Plan Comments:         Anesthesia Quick Evaluation

## 2020-05-03 NOTE — Op Note (Signed)
Cesarean Section Procedure Note  Pre-operative Diagnosis: IUP at 38 weeks, previous c/s x 2 for repeat, Polyhydraminos, LGA  Post-operative Diagnosis: same  Surgeon: Turner Daniels   Assistants: Anyanwu  Anesthesia: spinal  Procedure:  Low Segment Transverse cesarean section  Procedure Details  The patient was seen in the Holding Room. The risks, benefits, complications, treatment options, and expected outcomes were discussed with the patient.  The patient concurred with the proposed plan, giving informed consent.  The site of surgery properly noted/marked.. A Time Out was held and the above information confirmed.  After induction of anesthesia, the patient was draped and prepped in the usual sterile manner. A Pfannenstiel incision was made and carried down through the subcutaneous tissue to the fascia. Fascial incision was made and extended transversely. The fascia was separated from the underlying rectus tissue superiorly and inferiorly. The peritoneum was identified and entered. Peritoneal incision was extended longitudinally. The utero-vesical peritoneal reflection was incised transversely and the bladder flap was bluntly freed from the lower uterine segment. A low transverse uterine incision was made. Delivered from verex presentation was a baby with Apgar scores of 8 at one minute and 8 at five minutes. After the umbilical cord was clamped and cut cord blood was obtained for evaluation. The placenta was removed intact and appeared normal. The uterine outline, tubes and ovaries appeared normal. The uterine incision was closed with running locked sutures of 0 monocryl and imbricated with 0 monocryl. Hemostasis was observed. Lavage was carried out until clear. The peritoneum was then closed with 0 monocryl and rectus muscles plicated in the midline.  After hemostasis was assured, the fascia was then reapproximated with running sutures of Double stranded PDS. Irrigation was applied and after  adequate hemostasis was assured, the skin was reapproximated with subcutaneous sutures using 4-0 monocryl.  Instrument, sponge, and needle counts were correct prior the abdominal closure and at the conclusion of the case. The patient received clinda and gent preoperatively.  Findings: Viable female  Estimated Blood Loss:  582cc         Specimens: Placenta was sent to labor and delivery         Complications:  None

## 2020-05-04 LAB — CBC
HCT: 29.1 % — ABNORMAL LOW (ref 36.0–46.0)
Hemoglobin: 8.9 g/dL — ABNORMAL LOW (ref 12.0–15.0)
MCH: 26 pg (ref 26.0–34.0)
MCHC: 30.6 g/dL (ref 30.0–36.0)
MCV: 85.1 fL (ref 80.0–100.0)
Platelets: 213 10*3/uL (ref 150–400)
RBC: 3.42 MIL/uL — ABNORMAL LOW (ref 3.87–5.11)
RDW: 13.9 % (ref 11.5–15.5)
WBC: 10.6 10*3/uL — ABNORMAL HIGH (ref 4.0–10.5)
nRBC: 0 % (ref 0.0–0.2)

## 2020-05-04 NOTE — Progress Notes (Signed)
Subjective: Postpartum Day 1: Cesarean Delivery Patient reports tolerating PO.  She denies dizziness, SOB, etc. Lochia appropriate. Did get up once without dizziness. Has foley in place still.   Objective: Vital signs in last 24 hours: Temp:  [97.9 F (36.6 C)-98.9 F (37.2 C)] 98.1 F (36.7 C) (01/27 0500) Pulse Rate:  [58-84] 58 (01/27 0500) Resp:  [13-27] 16 (01/27 0500) BP: (77-106)/(39-61) 91/44 (01/27 0500) SpO2:  [94 %-99 %] 98 % (01/27 0500)  Physical Exam:  General: alert Lochia: appropriate Uterine Fundus: firm Incision: healing well DVT Evaluation: No evidence of DVT seen on physical exam.  Recent Labs    05/03/20 0729 05/04/20 0609  HGB 11.3* 8.9*  HCT 37.8 29.1*    Assessment/Plan: Status post Cesarean section. Postoperative course complicated by hypotension and anemia. Drop in EBL from 11 to 8.9 considered appropriate given EBL and lochia after. She is not tachycardic and has a benign exam. BP has been low consistently - this was an issue after her last CS as well. Orthostatics normal. No evidence of continued abnormal bleeding. Will continue to monitor. Remove foley and ambulate as tolerated.    Megan Barton Megan Barton 05/04/2020, 8:15 AM

## 2020-05-05 MED ORDER — FERROUS SULFATE 325 (65 FE) MG PO TBEC: 325.0000 mg | DELAYED_RELEASE_TABLET | Freq: Every day | ORAL | 0 refills | Status: DC

## 2020-05-05 MED ORDER — ACETAMINOPHEN 325 MG PO TABS: 650.0000 mg | ORAL_TABLET | Freq: Four times a day (QID) | ORAL | 0 refills | Status: DC | PRN

## 2020-05-05 MED ORDER — IBUPROFEN 600 MG PO TABS: 600.0000 mg | ORAL_TABLET | Freq: Four times a day (QID) | ORAL | 0 refills | Status: DC | PRN

## 2020-05-05 NOTE — Discharge Summary (Signed)
Postpartum Discharge Summary  Date of Service updated 05/05/20     Patient Name: Megan Barton DOB: 05-02-1990 MRN: [id number]  Date of admission: 05/03/2020 Delivery date:05/03/2020  Delivering provider: Louretta Shorten  Date of discharge: 05/05/2020  Admitting diagnosis: Previous cesarean section [Z98.891] Cesarean delivery delivered [O82] Intrauterine pregnancy: [redacted]w[redacted]d    Secondary diagnosis:  Active Problems:   Previous cesarean section   Previous cesarean section   Cesarean delivery delivered  Additional problems:     Discharge diagnosis: Term Pregnancy Delivered and Anemia                                              Post partum procedures: Augmentation: N/A Complications: None  Hospital course: Sceduled C/S   30y.o. yo G3P3003 at [id number] admitted to the hospital 05/03/2020 for scheduled cesarean section with the following indication:Prior Uterine Surgery.Delivery details are as follows:  Membrane Rupture Time/Date: 10:36 AM ,05/03/2020   Delivery Method:C-Section, Low Transverse  Details of operation can be found in separate operative note.  Patient had an uncomplicated postpartum course.  She is ambulating, tolerating a regular diet, passing flatus, and urinating well. Patient is discharged home in stable condition on  05/05/20        Newborn Data: Birth date:05/03/2020  Birth time:10:37 AM  Gender:Female  Living status:Living  Apgars:8 ,8  Weight:4451 g     Magnesium Sulfate received: No BMZ received: No Rhophylac:No MMR:No T-DaP:Given prenatally Flu: No Transfusion:No  Physical exam  Vitals:   05/04/20 0915 05/04/20 1440 05/04/20 2053 05/05/20 0539  BP:  (!) 109/51 115/62 112/68  Pulse:  82 77 78  Resp:  18 17 17   Temp:  98.2 F (36.8 C) 98.2 F (36.8 C) 97.7 F (36.5 C)  TempSrc:  Oral Oral Oral  SpO2: 98% 97% 98% 99%  Weight:      Height:       General: alert, cooperative and no distress Lochia: appropriate Uterine Fundus: firm Incision:  Healing well with no significant drainage DVT Evaluation: No evidence of DVT seen on physical exam. Labs: Lab Results  Component Value Date   WBC 10.6 (H) 05/04/2020   HGB 8.9 (L) 05/04/2020   HCT 29.1 (L) 05/04/2020   MCV 85.1 05/04/2020   PLT 213 05/04/2020   CMP Latest Ref Rng & Units 05/28/2017  Glucose 65 - 99 mg/dL 104(H)  BUN 6 - 20 mg/dL 7  Creatinine 0.44 - 1.00 mg/dL 0.37(L)  Sodium 135 - 145 mmol/L 133(L)  Potassium 3.5 - 5.1 mmol/L 3.4(L)  Chloride 101 - 111 mmol/L 102  CO2 22 - 32 mmol/L 22  Calcium 8.9 - 10.3 mg/dL 8.4(L)  Total Protein 6.5 - 8.1 g/dL 7.0  Total Bilirubin 0.3 - 1.2 mg/dL 0.7  Alkaline Phos 38 - 126 U/L 67  AST 15 - 41 U/L 17  ALT 14 - 54 U/L 15   Edinburgh Score: Edinburgh Postnatal Depression Scale Screening Tool 05/04/2020  I have been able to laugh and see the funny side of things. 0  I have looked forward with enjoyment to things. 0  I have blamed myself unnecessarily when things went wrong. 1  I have been anxious or worried for no good reason. 1  I have felt scared or panicky for no good reason. 0  Things have been getting on top of me. 1  I have been so unhappy that I have had difficulty sleeping. 0  I have felt sad or miserable. 0  I have been so unhappy that I have been crying. 0  The thought of harming myself has occurred to me. 0  Edinburgh Postnatal Depression Scale Total 3      After visit meds:  Allergies as of 05/05/2020      Reactions   Keflex [cephalexin] Rash      Medication List    TAKE these medications   acetaminophen 325 MG tablet Commonly known as: TYLENOL Take 2 tablets (650 mg total) by mouth every 6 (six) hours as needed for mild pain (temperature > 101.5.).   ferrous sulfate 325 (65 FE) MG EC tablet Take 1 tablet (325 mg total) by mouth daily with breakfast.   ibuprofen 600 MG tablet Commonly known as: ADVIL Take 1 tablet (600 mg total) by mouth every 6 (six) hours as needed for headache, mild pain,  moderate pain or cramping.   prenatal multivitamin Tabs tablet Take 1 tablet by mouth daily at 12 noon.        Discharge home in stable condition Infant Feeding: Breast Infant Disposition:home with mother Discharge instruction: per After Visit Summary and Postpartum booklet. Activity: Advance as tolerated. Pelvic rest for 6 weeks.  Diet: routine diet Anticipated Birth Control: Unsure Postpartum Appointment:6 weeks Additional Postpartum F/U:  Future Appointments: Future Appointments  Date Time Provider Philip  05/09/2020  9:00 AM [facility]-LD PAT 1 [facility]-INDC None  05/09/2020 10:00 AM MC-SCREENING [facility]-SDSC None   Follow up Visit:      05/05/2020 Allena Katz, MD

## 2020-05-09 ENCOUNTER — Other Ambulatory Visit (HOSPITAL_COMMUNITY): Payer: Commercial Managed Care - PPO

## 2020-05-09 ENCOUNTER — Encounter (HOSPITAL_COMMUNITY)
Admission: RE | Admit: 2020-05-09 | Discharge: 2020-05-09 | Disposition: A | Discharge status: Home / Self Care | Payer: Commercial Managed Care - PPO | Source: Ambulatory Visit | Attending: Obstetrics & Gynecology | Admitting: Obstetrics & Gynecology

## 2020-05-10 ENCOUNTER — Encounter (HOSPITAL_COMMUNITY): Payer: Self-pay | Admitting: Obstetrics and Gynecology

## 2020-05-10 ENCOUNTER — Inpatient Hospital Stay (HOSPITAL_COMMUNITY)
Admission: AD | Admit: 2020-05-10 | Discharge: 2020-05-12 | DRG: 776 | Disposition: A | Discharge status: Home / Self Care | Payer: Commercial Managed Care - PPO | Attending: Obstetrics and Gynecology | Admitting: Obstetrics and Gynecology

## 2020-05-10 ENCOUNTER — Other Ambulatory Visit: Payer: Self-pay

## 2020-05-10 DIAGNOSIS — R030 Elevated blood-pressure reading, without diagnosis of hypertension: Secondary | ICD-10-CM | POA: Diagnosis not present

## 2020-05-10 DIAGNOSIS — O1495 Unspecified pre-eclampsia, complicating the puerperium: Secondary | ICD-10-CM | POA: Diagnosis not present

## 2020-05-10 DIAGNOSIS — O1415 Severe pre-eclampsia, complicating the puerperium: Secondary | ICD-10-CM | POA: Diagnosis not present

## 2020-05-10 DIAGNOSIS — Z20822 Contact with and (suspected) exposure to covid-19: Secondary | ICD-10-CM | POA: Diagnosis present

## 2020-05-10 LAB — SARS CORONAVIRUS 2 BY RT PCR (HOSPITAL ORDER, PERFORMED IN ~~LOC~~ HOSPITAL LAB): SARS Coronavirus 2: NEGATIVE

## 2020-05-10 LAB — COMPREHENSIVE METABOLIC PANEL
ALT: 41 U/L (ref 0–44)
AST: 29 U/L (ref 15–41)
Albumin: 2.7 g/dL — ABNORMAL LOW (ref 3.5–5.0)
Alkaline Phosphatase: 89 U/L (ref 38–126)
Anion gap: 12 (ref 5–15)
BUN: 15 mg/dL (ref 6–20)
CO2: 22 mmol/L (ref 22–32)
Calcium: 8.6 mg/dL — ABNORMAL LOW (ref 8.9–10.3)
Chloride: 106 mmol/L (ref 98–111)
Creatinine, Ser: 0.78 mg/dL (ref 0.44–1.00)
GFR, Estimated: >60 mL/min (ref >60)
Glucose, Bld: 100 mg/dL — ABNORMAL HIGH (ref 70–99)
Potassium: 3.4 mmol/L — ABNORMAL LOW (ref 3.5–5.1)
Sodium: 140 mmol/L (ref 135–145)
Total Bilirubin: 0.3 mg/dL (ref 0.3–1.2)
Total Protein: 6.1 g/dL — ABNORMAL LOW (ref 6.5–8.1)

## 2020-05-10 LAB — PROTEIN / CREATININE RATIO, URINE
Creatinine, Urine: 126.74 mg/dL
Protein Creatinine Ratio: 0.24 mg/mg{creat} — ABNORMAL HIGH (ref 0.00–0.15)
Total Protein, Urine: 30 mg/dL

## 2020-05-10 LAB — CBC
HCT: 34.9 % — ABNORMAL LOW (ref 36.0–46.0)
Hemoglobin: 11.1 g/dL — ABNORMAL LOW (ref 12.0–15.0)
MCH: 26.4 pg (ref 26.0–34.0)
MCHC: 31.8 g/dL (ref 30.0–36.0)
MCV: 83.1 fL (ref 80.0–100.0)
Platelets: 320 10*3/uL (ref 150–400)
RBC: 4.2 MIL/uL (ref 3.87–5.11)
RDW: 13.8 % (ref 11.5–15.5)
WBC: 8.3 10*3/uL (ref 4.0–10.5)
nRBC: 0 % (ref 0.0–0.2)

## 2020-05-10 MED ORDER — HYDRALAZINE HCL 20 MG/ML IJ SOLN: 10.0000 mg | INTRAMUSCULAR | Status: DC | PRN

## 2020-05-10 MED ORDER — LABETALOL HCL 5 MG/ML IV SOLN: 80.0000 mg | INTRAVENOUS | Status: DC | PRN

## 2020-05-10 MED ORDER — LABETALOL HCL 5 MG/ML IV SOLN: 20.0000 mg | INTRAVENOUS | Status: DC | PRN

## 2020-05-10 MED ORDER — ACETAMINOPHEN 325 MG PO TABS: 650.0000 mg | ORAL_TABLET | Freq: Four times a day (QID) | ORAL | Status: DC | PRN

## 2020-05-10 MED ORDER — HYDRALAZINE HCL 20 MG/ML IJ SOLN
10.0000 mg | INTRAMUSCULAR | Status: DC | PRN
Start: 2020-05-10 — End: 2020-05-10

## 2020-05-10 MED ORDER — LACTATED RINGERS IV SOLN: INTRAVENOUS | Status: DC

## 2020-05-10 MED ORDER — LABETALOL HCL 5 MG/ML IV SOLN: 40.0000 mg | INTRAVENOUS | Status: DC | PRN

## 2020-05-10 MED ORDER — MAGNESIUM SULFATE 40 GM/1000ML IV SOLN
2.0000 g/h | INTRAVENOUS | Status: AC
  Administered 2020-05-10 – 2020-05-11 (×2): 2 g/h via INTRAVENOUS
  Filled 2020-05-10: qty 1000

## 2020-05-10 MED ORDER — LABETALOL HCL 200 MG PO TABS: 200.0000 mg | ORAL_TABLET | Freq: Two times a day (BID) | ORAL | Status: DC

## 2020-05-10 MED ORDER — NIFEDIPINE ER OSMOTIC RELEASE 30 MG PO TB24: 30.0000 mg | ORAL_TABLET | Freq: Every day | ORAL | Status: DC

## 2020-05-10 MED ORDER — NIFEDIPINE ER OSMOTIC RELEASE 30 MG PO TB24
30.0000 mg | ORAL_TABLET | Freq: Every day | ORAL | Status: DC
Start: 2020-05-10 — End: 2020-05-12
  Administered 2020-05-10 – 2020-05-11 (×2): 30 mg via ORAL
  Filled 2020-05-10 (×2): qty 1

## 2020-05-10 MED ORDER — MAGNESIUM SULFATE BOLUS VIA INFUSION
4.0000 g | Freq: Once | INTRAVENOUS | Status: AC
  Administered 2020-05-10: 4 g via INTRAVENOUS
  Filled 2020-05-10: qty 1000

## 2020-05-10 MED ORDER — IBUPROFEN 600 MG PO TABS
600.0000 mg | ORAL_TABLET | Freq: Four times a day (QID) | ORAL | Status: DC | PRN
  Administered 2020-05-10 – 2020-05-11 (×2): 600 mg via ORAL
  Filled 2020-05-10 (×2): qty 1

## 2020-05-10 MED ORDER — BUTALBITAL-APAP-CAFFEINE 50-325-40 MG PO TABS
2.0000 | ORAL_TABLET | Freq: Once | ORAL | Status: AC
  Administered 2020-05-10: 2 via ORAL
  Filled 2020-05-10: qty 2

## 2020-05-10 MED ORDER — LABETALOL HCL 5 MG/ML IV SOLN
20.0000 mg | INTRAVENOUS | Status: DC | PRN
  Administered 2020-05-10: 20 mg via INTRAVENOUS
  Filled 2020-05-10: qty 4

## 2020-05-10 MED ORDER — MAGNESIUM SULFATE 40 GM/1000ML IV SOLN
INTRAVENOUS | Status: AC
  Filled 2020-05-10: qty 1000

## 2020-05-10 NOTE — MAU Note (Signed)
Pt states that yesterday she started having left sided neck pain and headache. Pt thought it was because she has been sleeping on the love seat since she just had a baby.   Pt reports headache is worse today and took blood pressure at home and it was 182/100.   Pt reports trying to get an appointment with her OBGYN and they told her to come to the hospital.

## 2020-05-10 NOTE — MAU Provider Note (Signed)
History     CSN: [id number]  Arrival date and time: 05/10/20 1708   Event Date/Time   First Provider Initiated Contact with Patient 05/10/20 1752      Chief Complaint  Patient presents with  . Hypertension   HPI Megan Barton is a 30 y.o. [phone] female who presents at 1 week s/ps RCS for headache & hypertension.  Reports headache since last night. Occipital throbbing headache that she rates 7/10. Took ibuprofen this afternoon with minimal to no relief. Also reports flashes of light in her peripheral vision since headache started. Endorses intermittent mid epigastric pain today. No n/v. No fever, CP, SOB, abd pain.  Denies history of hypertension or preeclampsia. Took BP at home & it was elevated.   OB History    Gravida  3   Para  3   Term  3   Preterm  0   AB  0   Living  3     SAB  0   IAB  0   Ectopic  0   Multiple  0   Live Births  3           Past Medical History:  Diagnosis Date  . Anemia   . Heartburn in pregnancy    zantac  . Obesity 01/06/2013  . UTI (lower urinary tract infection) 01/06/2013   resolved    Past Surgical History:  Procedure Laterality Date  . CESAREAN SECTION N/A 02/10/2014   Procedure: CESAREAN SECTION;  Surgeon: Mitchel Honour, DO;  Location: [facility];  Service: Obstetrics;  Laterality: N/A;  Primary edc 02/09/14  . CESAREAN SECTION N/A 09/29/2017   Procedure: REPEAT CESAREAN SECTION;  Surgeon: Mitchel Honour, DO;  Location: [facility];  Service: Obstetrics;  Laterality: N/A;  Repeat edc 7/5 allergy to Keflex need RNFA  . CESAREAN SECTION N/A 05/03/2020   Procedure: CESAREAN SECTION;  Surgeon: Candice Camp, MD;  Location: [facility] LD [facility]S;  Service: Obstetrics;  Laterality: N/A;  . WISDOM TOOTH EXTRACTION      Family History  Problem Relation Age of Onset  . Diabetes Mother   . Depression Mother   . Hypertension Father   . Hyperlipidemia Father   . Asthma Brother   . Cancer Paternal Aunt        breast  . Cancer  Paternal Grandfather        colon, liver    Social History   Tobacco Use  . Smoking status: Never Smoker  . Smokeless tobacco: Never Used  Vaping Use  . Vaping Use: Never used  Substance Use Topics  . Alcohol use: No  . Drug use: No    Allergies:  Allergies  Allergen Reactions  . Keflex [Cephalexin] Rash    Medications Prior to Admission  Medication Sig Dispense Refill Last Dose  . acetaminophen (TYLENOL) 325 MG tablet Take 2 tablets (650 mg total) by mouth every 6 (six) hours as needed for mild pain (temperature > 101.5.). 60 tablet 0 Past Week at Unknown time  . ibuprofen (ADVIL) 600 MG tablet Take 1 tablet (600 mg total) by mouth every 6 (six) hours as needed for headache, mild pain, moderate pain or cramping. 60 tablet 0 05/10/2020 at Unknown time  . Prenatal Vit-Fe Fumarate-FA (PRENATAL MULTIVITAMIN) TABS tablet Take 1 tablet by mouth daily at 12 noon.   05/10/2020 at Unknown time  . ferrous sulfate 325 (65 FE) MG EC tablet Take 1 tablet (325 mg total) by mouth daily with breakfast. 30 tablet  0     Review of Systems  Constitutional: Negative.   Eyes: Positive for visual disturbance. Negative for photophobia.  Respiratory: Negative.   Cardiovascular: Positive for leg swelling. Negative for chest pain.  Gastrointestinal: Positive for abdominal pain.  Neurological: Positive for headaches.   Physical Exam   Patient Vitals for the past 24 hrs:  BP Temp Pulse Resp SpO2 Weight  05/10/20 1751 (!) 168/76 - (!) 50 - - -  05/10/20 1734 (!) 176/78 - (!) 53 - - -  05/10/20 1732 - 98.1 F (36.7 C) - 16 99 % 130.8 kg     Physical Exam Vitals and nursing note reviewed.  Constitutional:      General: She is not in acute distress.    Appearance: Normal appearance.  HENT:     Head: Normocephalic and atraumatic.  Cardiovascular:     Rate and Rhythm: Normal rate.  Pulmonary:     Effort: Pulmonary effort is normal. No respiratory distress.  Abdominal:     Tenderness: There is  no abdominal tenderness.  Musculoskeletal:     Right lower leg: 1+ Pitting Edema present.     Left lower leg: 1+ Pitting Edema present.  Skin:    General: Skin is warm and dry.  Neurological:     Mental Status: She is alert.     Deep Tendon Reflexes:     Reflex Scores:      Patellar reflexes are 2+ on the right side and 2+ on the left side.    Comments: No clonus     MAU Course  Procedures No results found for this or any previous visit (from the past 24 hour(s)).  MDM PP patient with headache & new onset severe range BPs. IV antihypertensive protocol ordered. CBC & CMP collected.  Fioricet ordered to treat headache.   Dr Lorane Gell on unit; discussed patient with him. Will admit for PP preeclampsia w/severe features to receive mag sulfate  Assessment and Plan   1. Preeclampsia in postpartum period    -admit to obsc unit. Dr. Lorane Gell in to speak with patient & place orders  [name] 05/10/2020, 6:21 PM

## 2020-05-10 NOTE — H&P (Signed)
History and Physical  Megan Barton is a 30 y.o. female G3P3003 that delivered via repeat C section on 05/03/2020 for polyhydramnios, LGA, history of prior section. She presents with a 1 day history of headache not relieved by tylenol/motrin, blurred vision, and elevated blood pressure at home.  Upon presentation, blood pressure was elevated in the severe range, required 20 mg IV labetalol x 1.  Otherwise, she does not have postpartum complaints. She denies abdominal distension, RUQ or epigastric pain. Reports incisional pain with movement. Lochia is light.   OB History    Gravida  3   Para  3   Term  3   Preterm  0   AB  0   Living  3     SAB  0   IAB  0   Ectopic  0   Multiple  0   Live Births  3          Past Medical History:  Diagnosis Date  . Anemia   . Heartburn in pregnancy    zantac  . Obesity 01/06/2013  . UTI (lower urinary tract infection) 01/06/2013   resolved   Past Surgical History:  Procedure Laterality Date  . CESAREAN SECTION N/A 02/10/2014   Procedure: CESAREAN SECTION;  Surgeon: Mitchel Honour, DO;  Location: [facility];  Service: Obstetrics;  Laterality: N/A;  Primary edc 02/09/14  . CESAREAN SECTION N/A 09/29/2017   Procedure: REPEAT CESAREAN SECTION;  Surgeon: Mitchel Honour, DO;  Location: [facility];  Service: Obstetrics;  Laterality: N/A;  Repeat edc 7/5 allergy to Keflex need RNFA  . CESAREAN SECTION N/A 05/03/2020   Procedure: CESAREAN SECTION;  Surgeon: Candice Camp, MD;  Location: [facility] LD ORS;  Service: Obstetrics;  Laterality: N/A;  . WISDOM TOOTH EXTRACTION     Family History: family history includes Asthma in her brother; Cancer in her paternal aunt and paternal grandfather; Depression in her mother; Diabetes in her mother; Hyperlipidemia in her father; Hypertension in her father. Social History:  reports that she has never smoked. She has never used smokeless tobacco. She reports that she does not drink alcohol and does not use  drugs.      Review of Systems History   Blood pressure 130/60, pulse 64, temperature 98.3 F (36.8 C), temperature source Oral, resp. rate 18, weight 130.8 kg, SpO2 97 %, currently breastfeeding. Exam Physical Exam  ZGY:FVCBS, no distress, well appearing Chest: nonlabored breathing CV: 1+ edema No hyperreflexia, clonus Incision: honeycomb in place, patient decline to remove at this time.   Prenatal labs: ABO, Rh: --/--/B POS (01/26 0729) Antibody: NEG (01/26 0729) Rubella: Immune (07/06 0000) RPR: NON REACTIVE (01/26 0729)  HBsAg: Negative (07/06 0000)  HIV: Non-reactive (07/06 0000)  GBS: Negative/-- (01/10 0000)   Assessment/Plan: . Admit to [facility] for postpartum preeclampsia. o CMET, CBC wnl o Mag sulfate gtt o Trial fioricet for headache o Antihypertensive protocol.  Has require 20 mg IV labetalol x 1 with good response.  Will consider Adding PO dose. o OK for regular diet.  Megan Barton 05/10/2020, 7:57 PM

## 2020-05-11 DIAGNOSIS — O1495 Unspecified pre-eclampsia, complicating the puerperium: Secondary | ICD-10-CM | POA: Diagnosis present

## 2020-05-11 MED ORDER — CYCLOBENZAPRINE HCL 10 MG PO TABS
5.0000 mg | ORAL_TABLET | Freq: Three times a day (TID) | ORAL | Status: DC | PRN
  Administered 2020-05-11: 5 mg via ORAL
  Filled 2020-05-11: qty 1

## 2020-05-11 NOTE — Progress Notes (Signed)
Still has mild ache in head and neck  Today's Vitals   05/11/20 1115 05/11/20 1140 05/11/20 1200 05/11/20 1251  BP:  (!) 120/59    Pulse:  84    Resp:  18 19 18   Temp:  (!) 97.4 F (36.3 C)    TempSrc:  Oral    SpO2:  98%    Weight:      Height:      PainSc: 2       Body mass index is 41.37 kg/m.  UO good  Neck supple FFNT DTR 2+  A/P: PP preeclampsia         Finish 24 hours magnesium sulfate         Ibuprofen prn         Flexeril prn neck pain-will pump and dump         Continue nefedipine XL 30 mg QD         Probable discharge in am

## 2020-05-12 MED ORDER — FUROSEMIDE 10 MG/ML IJ SOLN
20.0000 mg | Freq: Once | INTRAMUSCULAR | Status: AC
  Administered 2020-05-12: 20 mg via INTRAVENOUS
  Filled 2020-05-12: qty 2

## 2020-05-12 MED ORDER — NIFEDIPINE ER 30 MG PO TB24: 30.0000 mg | ORAL_TABLET | Freq: Every day | ORAL | 0 refills | Status: DC

## 2020-05-12 NOTE — Progress Notes (Signed)
Discharge instructions and prescriptions given to pt. Discussed signs and symptoms to report to the MD, upcoming appointments, and meds. Pt verbalizes understanding and has no questions or concerns at this time. Pt discharged home from hospital in stable condition. 

## 2020-05-12 NOTE — Discharge Summary (Signed)
Physician Discharge Summary  Patient ID: Megan Barton MRN: [id number] DOB/AGE: 10/24/1990 30 y.o.  Admit date: 05/10/2020 Discharge date: 05/12/2020  Admission Diagnoses:PP preclampsia, Headache  Discharge Diagnoses: same Active Problems:   Hypertension in pregnancy, preeclampsia, severe, delivered/postpartum   Preeclampsia in postpartum period   Discharged Condition: good  Hospital Course: Megan Barton is a 30 y.o. female [id number] that delivered via repeat C section on 05/03/2020 for polyhydramnios, LGA, history of prior section. She presents with a 1 day history of headache not relieved by tylenol/motrin, blurred vision, and elevated blood pressure at home.  Upon presentation, blood pressure was elevated in the severe range, required 20 mg IV labetalol x 1. She was admitted and placed on IV magnesium for 24 hours and nifedipine 30mg .  Her headache resolved and she had good diuresis. She also received 1 dose oral lasix 20mg  to further affect diuresis after the magnesium d/c'd.  Normal labs throughout. Sent home on nifedipine, preeclampsia warnings and fu within 1 week in office  Consults: None  Significant Diagnostic Studies: labs: normal  Treatments: IV magnesium,antihypertensives  Discharge Exam: Blood pressure (!) 125/59, pulse 75, temperature 98.5 F (36.9 C), temperature source Oral, resp. rate 18, height 5\' 10"  (1.778 m), weight 126.1 kg, SpO2 97 %, currently breastfeeding. General appearance: alert, cooperative, appears stated age and no distress GI: soft, non-tender; bowel sounds normal; no masses,  no organomegaly Incision/Wound:CD&I  Disposition: Discharge disposition: 01-Home or Self Care       Discharge Instructions    Activity as tolerated   Complete by: As directed    Call MD for:   Complete by: As directed    Excessive vaginal bleeding or pain   Call MD for:  persistant nausea and vomiting   Complete by: As directed    Call MD for:  redness, tenderness,  or signs of infection (pain, swelling, redness, odor or green/yellow discharge around incision site)   Complete by: As directed    Call MD for:  severe uncontrolled pain   Complete by: As directed    Call MD for:  temperature >100.4   Complete by: As directed    Diet general   Complete by: As directed    Discharge instructions   Complete by: As directed    Follow up in office in 6 weeks     Allergies as of 05/12/2020      Reactions   Keflex [cephalexin] Rash      Medication List    TAKE these medications   acetaminophen 325 MG tablet Commonly known as: TYLENOL Take 2 tablets (650 mg total) by mouth every 6 (six) hours as needed for mild pain (temperature > 101.5.).   ferrous sulfate 325 (65 FE) MG EC tablet Take 1 tablet (325 mg total) by mouth daily with breakfast.   GAS-X EXTRA STRENGTH PO Take 2 capsules by mouth daily as needed (For gas pain.).   ibuprofen 600 MG tablet Commonly known as: ADVIL Take 1 tablet (600 mg total) by mouth every 6 (six) hours as needed for headache, mild pain, moderate pain or cramping.   NIFEdipine 30 MG 24 hr tablet Commonly known as: ADALAT CC Take 1 tablet (30 mg total) by mouth at bedtime.   prenatal multivitamin Tabs tablet Take 1 tablet by mouth daily at 12 noon.        Signed: 05/12/2020, 4:56 PM

## 2020-05-12 NOTE — Progress Notes (Signed)
Patient ID: Megan Barton, female   DOB: December 30, 1990, 30 y.o.   MRN: [id number] Nasirah is feeling better Still occa HA  When her BP goes up No RUQ pain, scotomata    02/03 0700  02/04 0659 02/04 0700  02/04 1021  Most Recent    Temp (F)     97.4Important-98.1 98.3  98.3 (36.8)  02/04 0802  Pulse Rate     80-92   80  02/04 0443  Resp     17-19 18  18   02/04 0802  BP     120/59Important-143/84Important 147/74Important  147/74Important  02/04 0802  SpO2 (%)     97-100 99  99  02/04 0802   DTRs 2/4 Inc CD&I 1+ edema  PP preeclampsia -s/p mag Labs normal BP controlled on procardia Neck/Head pain -better with flexeril Adhya is worried if she goes home and things get worse.  Is retaining fluid Check weight.  Will give lasix 20mg  and monitor UOP.  If good response, consider d/c

## 2020-05-12 NOTE — Discharge Instructions (Signed)
Preeclampsia and Eclampsia Preeclampsia is a serious condition that may develop during pregnancy. This condition involves high blood pressure during pregnancy and causes symptoms such as headaches, vision changes, and increased swelling in the legs, hands, and face. Preeclampsia occurs after 20 weeks of pregnancy. Eclampsia is a seizure that happens from worsening preeclampsia. Diagnosing and managing preeclampsia early is important. If not treated early, it can cause serious problems for mother and baby. There is no cure for this condition. However, during pregnancy, delivering the baby may be the best treatment for preeclampsia or eclampsia. For most women, symptoms of preeclampsia and eclampsia go away after giving birth. In rare cases, a woman may develop preeclampsia or eclampsia after giving birth. This usually occurs within 48 hours after childbirth but may occur up to 6 weeks after giving birth. What are the causes? The cause of this condition is not known. What increases the risk? The following factors make you more likely to develop preeclampsia:  Being pregnant for the first time or being pregnant with multiples.  Having had preeclampsia or a condition called hemolysis, elevated liver enzymes, and low platelet count (HELLP)syndrome during a past pregnancy.  Having a family history of preeclampsia.  Being older than age 35.  Being obese.  Becoming pregnant through fertility treatments. Conditions that reduce blood flow or oxygen to your placenta and baby may also increase your risk. These include:  High blood pressure before, during, or immediately following pregnancy.  Kidney disease.  Diabetes.  Blood clotting disorders.  Autoimmune diseases, such as lupus.  Sleep apnea. What are the signs or symptoms? Common symptoms of this condition include:  A severe, throbbing headache that does not go away.  Vision problems, such as blurred or double vision and light  sensitivity.  Pain in the stomach, especially the right upper region.  Pain in the shoulder. Other symptoms that may develop as the condition gets worse include:  Sudden weight gain because of fluid buildup in the body. This causes swelling of the face, hands, legs, and feet.  Severe nausea and vomiting.  Urinating less than usual.  Shortness of breath.  Seizures. How is this diagnosed? Your health care provider will ask you about symptoms and check for signs of preeclampsia during your prenatal visits. You will also have routine tests, including:  Checking your blood pressure.  Urine tests to check for protein.  Blood tests to assess your organ function.  Monitoring your baby's heart rate.  Ultrasounds to check fetal growth.   How is this treated? You and your health care provider will determine the treatment that is best for you. Treatment may include:  Frequent prenatal visits to check for preeclampsia.  Medicine to lower your blood pressure.  Medicine to prevent seizures.  Low-dose aspirin during your pregnancy.  Staying in the hospital, in severe cases. You will be given medicines to control your blood pressure and the amount of fluids in your body.  Delivering your baby. Work with your health care provider to manage any chronic health conditions, such as diabetes or kidney problems. Also, work with your health care provider to manage weight gain during pregnancy. Follow these instructions at home: Eating and drinking  Drink enough fluid to keep your urine pale yellow.  Avoid caffeine. Caffeine may increase blood pressure and heart rate and lead to dehydration.  Reduce the amount of salt that you eat. Lifestyle  Do not use any products that contain nicotine or tobacco. These products include cigarettes, chewing tobacco, and   vaping devices, such as e-cigarettes. If you need help quitting, ask your health care provider.  Do not use alcohol or drugs.  Avoid  stress as much as possible.  Rest and get plenty of sleep. General instructions  Take over-the-counter and prescription medicines only as told by your health care provider.  When lying down, lie on your left side. This keeps pressure off your major blood vessels.  When sitting or lying down, raise (elevate) your feet. Try putting pillows underneath your lower legs.  Exercise regularly. Ask your health care provider what kinds of exercise are best for you.  Check your blood pressure as often as recommended by your health care provider.  Keep all prenatal and follow-up visits. This is important.   Contact a health care provider if:  You have symptoms that may need treatment or closer monitoring. These include: ? Headaches. ? Stomach pain or nausea and vomiting. ? Shoulder pain. ? Vision problems, such as spots in front of your eyes or blurry vision. ? Sudden weight gain or increased swelling in your face, hands, legs, and feet. ? Increased anxiety or feeling of impending doom. ? Signs or symptoms of labor. Get help right away if:  You have any of the following symptoms: ? A seizure. ? Shortness of breath or trouble breathing. ? Trouble speaking or slurred speech. ? Fainting. ? Chest pain. These symptoms may represent a serious problem that is an emergency. Do not wait to see if the symptoms will go away. Get medical help right away. Call your local emergency services (911 in the [place] Do not drive yourself to the hospital. Summary  Preeclampsia is a serious condition that may develop during pregnancy.  Diagnosing and treating preeclampsia early is very important.  Keep all prenatal and follow-up visits. This is important.  Get help right away if you have a seizure, shortness of breath or trouble breathing, trouble speaking or slurred speech, chest pain, or fainting. This information is not intended to replace advice given to you by your health care provider. Make sure you  discuss any questions you have with your health care provider. Document Revised: 12/16/2019 Document Reviewed: 12/16/2019 Elsevier Patient Education  2021 [employer]vier Inc.  

## 2021-07-10 ENCOUNTER — Ambulatory Visit
Admission: EM | Admit: 2021-07-10 | Discharge: 2021-07-10 | Disposition: A | Discharge status: Home / Self Care | Payer: Commercial Managed Care - PPO | Attending: Family Medicine | Admitting: Family Medicine

## 2021-07-10 DIAGNOSIS — J0300 Acute streptococcal tonsillitis, unspecified: Secondary | ICD-10-CM

## 2021-07-10 LAB — POCT RAPID STREP A (OFFICE): Rapid Strep A Screen: POSITIVE — AB

## 2021-07-10 MED ORDER — LIDOCAINE VISCOUS HCL 2 % MT SOLN: 10.0000 mL | OROMUCOSAL | 0 refills | Status: DC | PRN

## 2021-07-10 MED ORDER — AZITHROMYCIN 250 MG PO TABS: ORAL_TABLET | ORAL | 0 refills | Status: DC

## 2021-07-10 MED ORDER — FLUCONAZOLE 150 MG PO TABS
150.0000 mg | ORAL_TABLET | ORAL | 0 refills | Status: DC
Start: 2021-07-10 — End: 2021-11-27

## 2021-07-10 NOTE — ED Provider Notes (Signed)
?[facility] ? ? ? ?CSN: [id number] ?Arrival date & time: 07/10/21  1158 ? ? ?  ? ?History   ?Chief Complaint ?Chief Complaint  ?Patient presents with  ? Sore Throat  ?  Cough and sore throat  ? ?HPI ?CYPRESS HINKSON is a 31 y.o. female.  ? ?Presenting today with 1 day history of sore, swollen feeling throat, body aches, headache.  Denies fever, chills, cough, congestion, abdominal pain, nausea vomiting or diarrhea.  Not taking any medications for symptoms thus far.  States she had strep about a month ago and this feels similar.  She completed her full course of medications, change her toothbrush and sanitize her home after last episode and felt full resolution up until now. ? ?Past Medical History:  ?Diagnosis Date  ? Anemia   ? Heartburn in pregnancy   ? zantac  ? Obesity 01/06/2013  ? UTI (lower urinary tract infection) 01/06/2013  ? resolved  ? ? ?Patient Active Problem List  ? Diagnosis Date Noted  ? Preeclampsia in postpartum period 05/11/2020  ? Hypertension in pregnancy, preeclampsia, severe, delivered/postpartum 05/10/2020  ? Previous cesarean section 05/03/2020  ? Previous cesarean section 05/03/2020  ? Cesarean delivery delivered 05/03/2020  ? S/P cesarean section 02/10/2014  ? Obesity in pregnancy, antepartum 07/21/2013  ? Supervision of normal first pregnancy 06/22/2013  ? ? ?Past Surgical History:  ?Procedure Laterality Date  ? CESAREAN SECTION N/A 02/10/2014  ? Procedure: CESAREAN SECTION;  Surgeon: Mitchel Honour, DO;  Location: [facility];  Service: Obstetrics;  Laterality: N/A;  Primary ?edc 02/09/14  ? CESAREAN SECTION N/A 09/29/2017  ? Procedure: REPEAT CESAREAN SECTION;  Surgeon: Mitchel Honour, DO;  Location: [facility];  Service: Obstetrics;  Laterality: N/A;  Repeat ?edc 7/5 ?allergy to Keflex ?need RNFA  ? CESAREAN SECTION N/A 05/03/2020  ? Procedure: CESAREAN SECTION;  Surgeon: Candice Camp, MD;  Location: [facility];  Service: Obstetrics;  Laterality: N/A;  ? WISDOM TOOTH EXTRACTION     ? ? ?OB History   ? ? Gravida  ?3  ? Para  ?3  ? Term  ?3  ? Preterm  ?0  ? AB  ?0  ? Living  ?3  ?  ? ? SAB  ?0  ? IAB  ?0  ? Ectopic  ?0  ? Multiple  ?0  ? Live Births  ?3  ?   ?  ?  ? ? ? ?Home Medications   ? ?Prior to Admission medications   ?Medication Sig Start Date End Date Taking? Authorizing Provider  ?azithromycin (ZITHROMAX) 250 MG tablet Take first 2 tablets together, then 1 every day until finished. 07/10/21  Yes Particia Nearing, PA-C  ?fluconazole (DIFLUCAN) 150 MG tablet Take 1 tablet (150 mg total) by mouth once a week. 07/10/21  Yes Particia Nearing, PA-C  ?lidocaine (XYLOCAINE) 2 % solution Use as directed 10 mLs in the mouth or throat every 3 (three) hours as needed for mouth pain. 07/10/21  Yes Particia Nearing, PA-C  ?acetaminophen (TYLENOL) 325 MG tablet Take 2 tablets (650 mg total) by mouth every 6 (six) hours as needed for mild pain (temperature > 101.5.). 05/05/20   Harold Hedge, MD  ?ferrous sulfate 325 (65 FE) MG EC tablet Take 1 tablet (325 mg total) by mouth daily with breakfast. 05/05/20   Harold Hedge, MD  ?ibuprofen (ADVIL) 600 MG tablet Take 1 tablet (600 mg total) by mouth every 6 (six) hours as needed for headache, mild  pain, moderate pain or cramping. 05/05/20   Harold Hedgeomblin, James, MD  ?NIFEdipine (ADALAT CC) 30 MG 24 hr tablet Take 1 tablet (30 mg total) by mouth at bedtime. 05/12/20   Candice CampLowe, David, MD  ?Prenatal Vit-Fe Fumarate-FA (PRENATAL MULTIVITAMIN) TABS tablet Take 1 tablet by mouth daily at 12 noon.    [provider]  ?Simethicone (GAS-X EXTRA STRENGTH PO) Take 2 capsules by mouth daily as needed (For gas pain.).    [provider]  ? ? ?Family History ?Family History  ?Problem Relation Age of Onset  ? Diabetes Mother   ? Depression Mother   ? Hypertension Father   ? Hyperlipidemia Father   ? Asthma Brother   ? Cancer Paternal Aunt   ?     breast  ? Cancer Paternal Grandfather   ?     colon, liver  ? ? ?Social History ?Social History   ? ?Tobacco Use  ? Smoking status: Never  ? Smokeless tobacco: Never  ?Vaping Use  ? Vaping Use: Never used  ?Substance Use Topics  ? Alcohol use: No  ? Drug use: No  ? ? ? ?Allergies   ?Keflex [cephalexin] ? ? ?Review of Systems ?Review of Systems ?Per HPI ? ?Physical Exam ?Triage Vital Signs ?ED Triage Vitals  ?Enc Vitals Group  ?   BP 07/10/21 1346 122/86  ?   Pulse Rate 07/10/21 1346 85  ?   Resp 07/10/21 1346 18  ?   Temp 07/10/21 1346 98.5 ?F (36.9 ?C)  ?   Temp Source 07/10/21 1346 Oral  ?   SpO2 07/10/21 1346 99 %  ?   Weight --   ?   Height --   ?   Head Circumference --   ?   Peak Flow --   ?   Pain Score 07/10/21 1343 4  ?   Pain Loc --   ?   Pain Edu? --   ?   Excl. in GC? --   ? ?No data found. ? ?Updated Vital Signs ?BP 122/86 (BP Location: Right Arm)   Pulse 85   Temp 98.5 ?F (36.9 ?C) (Oral)   Resp 18   LMP 07/03/2021 Comment: Pt has IUD but still has periods  SpO2 99%   Breastfeeding No  ? ?Visual Acuity ?Right Eye Distance:   ?Left Eye Distance:   ?Bilateral Distance:   ? ?Right Eye Near:   ?Left Eye Near:    ?Bilateral Near:    ? ?Physical Exam ?Vitals and nursing note reviewed.  ?Constitutional:   ?   Appearance: Normal appearance. She is not ill-appearing.  ?HENT:  ?   Head: Atraumatic.  ?   Right Ear: Tympanic membrane normal.  ?   Left Ear: Tympanic membrane normal.  ?   Mouth/Throat:  ?   Mouth: Mucous membranes are moist.  ?   Pharynx: Oropharynx is clear. Posterior oropharyngeal erythema present. No oropharyngeal exudate.  ?   Comments: Significant erythema of the posterior oropharynx and bilateral tonsils, moderate edema of tonsils.  Uvula midline, oral airway patent ?Eyes:  ?   Extraocular Movements: Extraocular movements intact.  ?   Conjunctiva/sclera: Conjunctivae normal.  ?Cardiovascular:  ?   Rate and Rhythm: Normal rate and regular rhythm.  ?   Heart sounds: Normal heart sounds.  ?Pulmonary:  ?   Effort: Pulmonary effort is normal.  ?   Breath sounds: Normal breath sounds. No  wheezing or rales.  ?Musculoskeletal:     ?  General: Normal range of motion.  ?   Cervical back: Normal range of motion and neck supple.  ?Skin: ?   General: Skin is warm and dry.  ?Neurological:  ?   Mental Status: She is alert and oriented to person, place, and time.  ?   Motor: No weakness.  ?   Gait: Gait normal.  ?Psychiatric:     ?   Mood and Affect: Mood normal.     ?   Thought Content: Thought content normal.     ?   Judgment: Judgment normal.  ? ? ? ?UC Treatments / Results  ?Labs ?(all labs ordered are listed, but only abnormal results are displayed) ?Labs Reviewed  ?POCT RAPID STREP A (OFFICE) - Abnormal; Notable for the following components:  ?    Result Value  ? Rapid Strep A Screen Positive (*)   ? All other components within normal limits  ? ? ?EKG ? ? ?Radiology ?No results found. ? ?Procedures ?Procedures (including critical care time) ? ?Medications Ordered in UC ?Medications - No data to display ? ?Initial Impression / Assessment and Plan / UC Course  ?I have reviewed the triage vital signs and the nursing notes. ? ?Pertinent labs & imaging results that were available during my care of the patient were reviewed by me and considered in my medical decision making (see chart for details). ? ?  ? ?Vital signs overall benign and reassuring, rapid strep positive will treat with azithromycin as she just finished a course of amoxicillin for this, viscous lidocaine.  She states she gets yeast infections with antibiotics so Diflucan sent.  Return for worsening symptoms. ? ?Final Clinical Impressions(s) / UC Diagnoses  ? ?Final diagnoses:  ?Strep tonsillitis  ? ?Discharge Instructions   ?None ?  ? ?ED Prescriptions   ? ? Medication Sig Dispense Auth. Provider  ? azithromycin (ZITHROMAX) 250 MG tablet Take first 2 tablets together, then 1 every day until finished. 6 tablet Particia Nearing, [place]  ? lidocaine (XYLOCAINE) 2 % solution Use as directed 10 mLs in the mouth or throat every 3 (three) hours  as needed for mouth pain. 100 mL Particia Nearing, PA-C  ? fluconazole (DIFLUCAN) 150 MG tablet Take 1 tablet (150 mg total) by mouth once a week. 2 tablet Particia Nearing, [place]  ? ?  ? ?PDMP not reviewed t

## 2021-07-10 NOTE — ED Triage Notes (Signed)
Pt states she woke up this morning and her throat was hurting ? ?Pt states she had strep last month and she thinks its back again ? ?Pt states she is having body aches, her throat and her head hurts ? ?Denies Fever ? ?Denies Meds ?

## 2021-10-22 ENCOUNTER — Other Ambulatory Visit: Payer: Self-pay | Admitting: Nurse Practitioner

## 2021-10-22 DIAGNOSIS — R109 Unspecified abdominal pain: Secondary | ICD-10-CM

## 2021-10-23 ENCOUNTER — Ambulatory Visit
Admission: RE | Admit: 2021-10-23 | Discharge: 2021-10-23 | Disposition: A | Discharge status: Home / Self Care | Payer: Commercial Managed Care - PPO | Source: Ambulatory Visit | Attending: Nurse Practitioner | Admitting: Nurse Practitioner

## 2021-10-23 DIAGNOSIS — R109 Unspecified abdominal pain: Secondary | ICD-10-CM

## 2021-11-13 ENCOUNTER — Encounter (HOSPITAL_COMMUNITY): Payer: Self-pay

## 2021-11-13 ENCOUNTER — Emergency Department (HOSPITAL_COMMUNITY)
Admission: EM | Admit: 2021-11-13 | Discharge: 2021-11-13 | Disposition: A | Discharge status: Home / Self Care | Payer: Commercial Managed Care - PPO | Attending: Emergency Medicine | Admitting: Emergency Medicine

## 2021-11-13 ENCOUNTER — Emergency Department (HOSPITAL_COMMUNITY): Payer: Commercial Managed Care - PPO

## 2021-11-13 ENCOUNTER — Other Ambulatory Visit: Payer: Self-pay

## 2021-11-13 DIAGNOSIS — K8020 Calculus of gallbladder without cholecystitis without obstruction: Secondary | ICD-10-CM | POA: Diagnosis not present

## 2021-11-13 DIAGNOSIS — R1011 Right upper quadrant pain: Secondary | ICD-10-CM | POA: Diagnosis present

## 2021-11-13 LAB — CBC WITH DIFFERENTIAL/PLATELET
Abs Immature Granulocytes: 0.02 10*3/uL (ref 0.00–0.07)
Basophils Absolute: 0 10*3/uL (ref 0.0–0.1)
Basophils Relative: 1 %
Eosinophils Absolute: 0.1 10*3/uL (ref 0.0–0.5)
Eosinophils Relative: 1 %
HCT: 39.3 % (ref 36.0–46.0)
Hemoglobin: 12.3 g/dL (ref 12.0–15.0)
Immature Granulocytes: 0 %
Lymphocytes Relative: 20 %
Lymphs Abs: 1.5 10*3/uL (ref 0.7–4.0)
MCH: 27.6 pg (ref 26.0–34.0)
MCHC: 31.3 g/dL (ref 30.0–36.0)
MCV: 88.3 fL (ref 80.0–100.0)
Monocytes Absolute: 0.6 10*3/uL (ref 0.1–1.0)
Monocytes Relative: 7 %
Neutro Abs: 5.6 10*3/uL (ref 1.7–7.7)
Neutrophils Relative %: 71 %
Platelets: 314 10*3/uL (ref 150–400)
RBC: 4.45 MIL/uL (ref 3.87–5.11)
RDW: 13 % (ref 11.5–15.5)
WBC: 7.8 10*3/uL (ref 4.0–10.5)
nRBC: 0 % (ref 0.0–0.2)

## 2021-11-13 LAB — URINALYSIS, ROUTINE W REFLEX MICROSCOPIC
Bilirubin Urine: NEGATIVE
Glucose, UA: NEGATIVE mg/dL
Hgb urine dipstick: NEGATIVE
Ketones, ur: NEGATIVE mg/dL
Leukocytes,Ua: NEGATIVE
Nitrite: NEGATIVE
Protein, ur: NEGATIVE mg/dL
Specific Gravity, Urine: 1.016 (ref 1.005–1.030)
pH: 7 (ref 5.0–8.0)

## 2021-11-13 LAB — COMPREHENSIVE METABOLIC PANEL
ALT: 17 U/L (ref 0–44)
AST: 17 U/L (ref 15–41)
Albumin: 3.8 g/dL (ref 3.5–5.0)
Alkaline Phosphatase: 68 U/L (ref 38–126)
Anion gap: 6 (ref 5–15)
BUN: 15 mg/dL (ref 6–20)
CO2: 26 mmol/L (ref 22–32)
Calcium: 8.9 mg/dL (ref 8.9–10.3)
Chloride: 107 mmol/L (ref 98–111)
Creatinine, Ser: 0.59 mg/dL (ref 0.44–1.00)
GFR, Estimated: >60 mL/min (ref >60)
Glucose, Bld: 129 mg/dL — ABNORMAL HIGH (ref 70–99)
Potassium: 3.7 mmol/L (ref 3.5–5.1)
Sodium: 139 mmol/L (ref 135–145)
Total Bilirubin: 0.5 mg/dL (ref 0.3–1.2)
Total Protein: 7.4 g/dL (ref 6.5–8.1)

## 2021-11-13 LAB — LIPASE, BLOOD: Lipase: 37 U/L (ref 11–51)

## 2021-11-13 LAB — POC URINE PREG, ED: Preg Test, Ur: NEGATIVE

## 2021-11-13 LAB — MAGNESIUM: Magnesium: 1.9 mg/dL (ref 1.7–2.4)

## 2021-11-13 MED ORDER — HYDROMORPHONE HCL 1 MG/ML IJ SOLN
0.5000 mg | Freq: Once | INTRAMUSCULAR | Status: AC
  Administered 2021-11-13: 0.5 mg via INTRAVENOUS
  Filled 2021-11-13: qty 0.5

## 2021-11-13 MED ORDER — IOHEXOL 300 MG/ML  SOLN
100.0000 mL | Freq: Once | INTRAMUSCULAR | Status: AC | PRN
  Administered 2021-11-13: 100 mL via INTRAVENOUS

## 2021-11-13 MED ORDER — LACTATED RINGERS IV BOLUS
1000.0000 mL | Freq: Once | INTRAVENOUS | Status: AC
  Administered 2021-11-13: 1000 mL via INTRAVENOUS

## 2021-11-13 MED ORDER — KETOROLAC TROMETHAMINE 15 MG/ML IJ SOLN
15.0000 mg | Freq: Once | INTRAMUSCULAR | Status: AC
  Administered 2021-11-13: 15 mg via INTRAVENOUS
  Filled 2021-11-13: qty 1

## 2021-11-13 MED ORDER — ONDANSETRON HCL 4 MG/2ML IJ SOLN
4.0000 mg | Freq: Once | INTRAMUSCULAR | Status: AC
  Administered 2021-11-13: 4 mg via INTRAVENOUS
  Filled 2021-11-13: qty 2

## 2021-11-13 NOTE — ED Notes (Signed)
Patient transported to CT 

## 2021-11-13 NOTE — ED Triage Notes (Signed)
Pt c/o right flank pain with nausea since 1 am.

## 2021-11-13 NOTE — Discharge Instructions (Addendum)
There is a telephone number below to call for a surgery follow-up.  Schedule an appointment to discuss possible removal of your gallbladder to avoid similar episodes.  If you do experience further episodes of severe symptoms, please return to the emergency department.

## 2021-11-13 NOTE — ED Provider Notes (Signed)
[facility] EMERGENCY DEPARTMENT Provider Note   CSN: [id number] Arrival date & time: 11/13/21  [id number]     History {Add pertinent medical, surgical, social history, OB history to HPI:1} Chief Complaint  Patient presents with   Flank Pain    [place] is a 31 y.o. female.   Flank Pain Associated symptoms include abdominal pain.  Patient presents for right flank/right upper quadrant pain.  She reports that she was in her normal state of health last night.  She went to bed at approximate 11 PM.  At 1 AM, she awoke with a stabbing right flank pain.  She had associated nausea.  She has not had any vomiting.  She tried to eat a peanut butter sandwich.  She was not able to finish it.  She did not have any worsening postprandial pain.  Pain seems to come in waves and ranges from 6-8 in severity.  She did take a Flexeril with minimal relief of her symptoms.  Medical history includes. Anemia and obesity.     Home Medications Prior to Admission medications   Medication Sig Start Date End Date Taking? Authorizing Provider  acetaminophen (TYLENOL) 325 MG tablet Take 2 tablets (650 mg total) by mouth every 6 (six) hours as needed for mild pain (temperature > 101.5.). 05/05/20   Everlene Farrier, MD  azithromycin (ZITHROMAX) 250 MG tablet Take first 2 tablets together, then 1 every day until finished. 07/10/21   Volney American, PA-C  ferrous sulfate 325 (65 FE) MG EC tablet Take 1 tablet (325 mg total) by mouth daily with breakfast. 05/05/20   Everlene Farrier, MD  fluconazole (DIFLUCAN) 150 MG tablet Take 1 tablet (150 mg total) by mouth once a week. 07/10/21   Volney American, PA-C  ibuprofen (ADVIL) 600 MG tablet Take 1 tablet (600 mg total) by mouth every 6 (six) hours as needed for headache, mild pain, moderate pain or cramping. 05/05/20   Everlene Farrier, MD  lidocaine (XYLOCAINE) 2 % solution Use as directed 10 mLs in the mouth or throat every 3 (three) hours as needed for mouth pain.  07/10/21   Volney American, PA-C  NIFEdipine (ADALAT CC) 30 MG 24 hr tablet Take 1 tablet (30 mg total) by mouth at bedtime. 05/12/20   Louretta Shorten, MD  Prenatal Vit-Fe Fumarate-FA (PRENATAL MULTIVITAMIN) TABS tablet Take 1 tablet by mouth daily at 12 noon.    [provider]  Simethicone (GAS-X EXTRA STRENGTH PO) Take 2 capsules by mouth daily as needed (For gas pain.).    [provider]      Allergies    Keflex [cephalexin]    Review of Systems   Review of Systems  Gastrointestinal:  Positive for abdominal pain and nausea.  Genitourinary:  Positive for flank pain.  All other systems reviewed and are negative.   Physical Exam Updated Vital Signs Temp 98.1 F (36.7 C) (Oral)   Ht 5\' 10"  (1.778 m)   Wt 122.5 kg   BMI 38.74 kg/m  Physical Exam Vitals and nursing note reviewed.  Constitutional:      General: She is not in acute distress.    Appearance: Normal appearance. She is well-developed. She is not ill-appearing, toxic-appearing or diaphoretic.  HENT:     Head: Normocephalic and atraumatic.     Right Ear: External ear normal.     Left Ear: External ear normal.     Nose: Nose normal.     Mouth/Throat:     Mouth:  Mucous membranes are moist.     Pharynx: Oropharynx is clear.  Eyes:     Extraocular Movements: Extraocular movements intact.     Conjunctiva/sclera: Conjunctivae normal.  Cardiovascular:     Rate and Rhythm: Normal rate and regular rhythm.  Pulmonary:     Effort: Pulmonary effort is normal. No respiratory distress.  Abdominal:     Palpations: Abdomen is soft.     Tenderness: There is abdominal tenderness. There is no right CVA tenderness, left CVA tenderness, guarding or rebound.  Musculoskeletal:        General: No swelling. Normal range of motion.     Cervical back: Normal range of motion and neck supple.     Right lower leg: No edema.     Left lower leg: No edema.  Skin:    General: Skin is warm and dry.     Capillary Refill:  Capillary refill takes less than 2 seconds.     Coloration: Skin is not jaundiced or pale.  Neurological:     General: No focal deficit present.     Mental Status: She is alert and oriented to person, place, and time.     Cranial Nerves: No cranial nerve deficit.     Sensory: No sensory deficit.     Motor: No weakness.     Coordination: Coordination normal.  Psychiatric:        Mood and Affect: Mood normal.        Behavior: Behavior normal.        Thought Content: Thought content normal.        Judgment: Judgment normal.     ED Results / Procedures / Treatments   Labs (all labs ordered are listed, but only abnormal results are displayed) Labs Reviewed - No data to display  EKG None  Radiology No results found.  Procedures Procedures  {Document cardiac monitor, telemetry assessment procedure when appropriate:1}  Medications Ordered in ED Medications - No data to display  ED Course/ Medical Decision Making/ A&P                           Medical Decision Making Amount and/or Complexity of Data Reviewed Labs: ordered. Radiology: ordered.  Risk Prescription drug management.   This patient presents to the ED for concern of right flank pain, this involves an extensive number of treatment options, and is a complaint that carries with it a high risk of complications and morbidity.  The differential diagnosis includes nephrolithiasis, pyelonephritis, cholecystitis, symptomatic cholelithiasis, pancreatitis, hepatitis, SBO, constipation   Co morbidities that complicate the patient evaluation  Anemia and obesity   Additional history obtained:  Additional history obtained from N/A External records from outside source obtained and reviewed including EMR   Lab Tests:  I Ordered, and personally interpreted labs.  The pertinent results include:  ***   Imaging Studies ordered:  I ordered imaging studies including ***  I independently visualized and interpreted  imaging which showed *** I agree with the radiologist interpretation   Cardiac Monitoring: / EKG:  The patient was maintained on a cardiac monitor.  I personally viewed and interpreted the cardiac monitored which showed an underlying rhythm of: ***   Consultations Obtained:  I requested consultation with the ***,  and discussed lab and imaging findings as well as pertinent plan - they recommend: ***   Problem List / ED Course / Critical interventions / Medication management  Patient is a 31 year old female who  presents with acute onset of right flank/right upper quadrant pain.  Onset of pain was 6 hours prior to arrival.  It did wake her up from sleep.  At time of going to bed last night, she was in her normal state of health.  Pain has been persistent and ranges in 6-8/10 in severity.  She has associated nausea without vomiting.  On exam, patient is overall well-appearing.  She does have some mild tenderness in area of lateral right upper quadrant.  Presentation could be consistent with nephrolithiasis and/or cholecystitis.  Patient to undergo lab work and imaging studies for further evaluation.  She was given IV fluids for hydration.  She was given Dilaudid and Zofran for symptomatic relief.  Initial lab work shows***. I ordered medication including ***  for ***  Reevaluation of the patient after these medicines showed that the patient {resolved/improved/worsened:23923::"improved"} I have reviewed the patients home medicines and have made adjustments as needed   Social Determinants of Health:  ***   Test / Admission - Considered:  ***   {Document critical care time when appropriate:1} {Document review of labs and clinical decision tools ie heart score, Chads2Vasc2 etc:1}  {Document your independent review of radiology images, and any outside records:1} {Document your discussion with family members, caretakers, and with consultants:1} {Document social determinants of health  affecting pt's care:1} {Document your decision making why or why not admission, treatments were needed:1} Final Clinical Impression(s) / ED Diagnoses Final diagnoses:  None    Rx / DC Orders ED Discharge Orders     None

## 2021-11-27 ENCOUNTER — Emergency Department (HOSPITAL_COMMUNITY)
Admission: EM | Admit: 2021-11-27 | Discharge: 2021-11-27 | Disposition: A | Discharge status: Home / Self Care | Payer: Commercial Managed Care - PPO | Attending: Emergency Medicine | Admitting: Emergency Medicine

## 2021-11-27 ENCOUNTER — Emergency Department (HOSPITAL_COMMUNITY): Payer: Commercial Managed Care - PPO

## 2021-11-27 ENCOUNTER — Encounter (HOSPITAL_COMMUNITY): Payer: Self-pay

## 2021-11-27 DIAGNOSIS — M791 Myalgia, unspecified site: Secondary | ICD-10-CM | POA: Insufficient documentation

## 2021-11-27 DIAGNOSIS — R109 Unspecified abdominal pain: Secondary | ICD-10-CM | POA: Diagnosis present

## 2021-11-27 DIAGNOSIS — N200 Calculus of kidney: Secondary | ICD-10-CM | POA: Insufficient documentation

## 2021-11-27 DIAGNOSIS — R519 Headache, unspecified: Secondary | ICD-10-CM | POA: Insufficient documentation

## 2021-11-27 LAB — URINALYSIS, ROUTINE W REFLEX MICROSCOPIC
Bacteria, UA: NONE SEEN
Bilirubin Urine: NEGATIVE
Glucose, UA: NEGATIVE mg/dL
Ketones, ur: NEGATIVE mg/dL
Leukocytes,Ua: NEGATIVE
Nitrite: NEGATIVE
Protein, ur: 30 mg/dL — AB
Specific Gravity, Urine: 1.031 — ABNORMAL HIGH (ref 1.005–1.030)
pH: 5 (ref 5.0–8.0)

## 2021-11-27 LAB — CBC WITH DIFFERENTIAL/PLATELET
Abs Immature Granulocytes: 0.01 10*3/uL (ref 0.00–0.07)
Basophils Absolute: 0 10*3/uL (ref 0.0–0.1)
Basophils Relative: 0 %
Eosinophils Absolute: 0 10*3/uL (ref 0.0–0.5)
Eosinophils Relative: 1 %
HCT: 40.9 % (ref 36.0–46.0)
Hemoglobin: 13 g/dL (ref 12.0–15.0)
Immature Granulocytes: 0 %
Lymphocytes Relative: 14 %
Lymphs Abs: 0.8 10*3/uL (ref 0.7–4.0)
MCH: 28 pg (ref 26.0–34.0)
MCHC: 31.8 g/dL (ref 30.0–36.0)
MCV: 88 fL (ref 80.0–100.0)
Monocytes Absolute: 0.6 10*3/uL (ref 0.1–1.0)
Monocytes Relative: 10 %
Neutro Abs: 4.3 10*3/uL (ref 1.7–7.7)
Neutrophils Relative %: 75 %
Platelets: 293 10*3/uL (ref 150–400)
RBC: 4.65 MIL/uL (ref 3.87–5.11)
RDW: 13.4 % (ref 11.5–15.5)
WBC: 5.7 10*3/uL (ref 4.0–10.5)
nRBC: 0 % (ref 0.0–0.2)

## 2021-11-27 LAB — BASIC METABOLIC PANEL
Anion gap: 6 (ref 5–15)
BUN: 11 mg/dL (ref 6–20)
CO2: 24 mmol/L (ref 22–32)
Calcium: 8.7 mg/dL — ABNORMAL LOW (ref 8.9–10.3)
Chloride: 109 mmol/L (ref 98–111)
Creatinine, Ser: 0.73 mg/dL (ref 0.44–1.00)
GFR, Estimated: >60 mL/min (ref >60)
Glucose, Bld: 128 mg/dL — ABNORMAL HIGH (ref 70–99)
Potassium: 3.8 mmol/L (ref 3.5–5.1)
Sodium: 139 mmol/L (ref 135–145)

## 2021-11-27 LAB — POC URINE PREG, ED: Preg Test, Ur: NEGATIVE

## 2021-11-27 MED ORDER — MORPHINE SULFATE (PF) 4 MG/ML IV SOLN
4.0000 mg | Freq: Once | INTRAVENOUS | Status: DC
  Filled 2021-11-27: qty 1

## 2021-11-27 MED ORDER — ONDANSETRON HCL 4 MG/2ML IJ SOLN
4.0000 mg | Freq: Once | INTRAMUSCULAR | Status: AC
  Administered 2021-11-27: 4 mg via INTRAVENOUS
  Filled 2021-11-27: qty 2

## 2021-11-27 MED ORDER — KETOROLAC TROMETHAMINE 30 MG/ML IJ SOLN
15.0000 mg | Freq: Once | INTRAMUSCULAR | Status: AC
  Administered 2021-11-27: 15 mg via INTRAVENOUS
  Filled 2021-11-27: qty 1

## 2021-11-27 MED ORDER — LACTATED RINGERS IV BOLUS
1000.0000 mL | Freq: Once | INTRAVENOUS | Status: AC
  Administered 2021-11-27: 1000 mL via INTRAVENOUS

## 2021-11-27 MED ORDER — TAMSULOSIN HCL 0.4 MG PO CAPS: 0.4000 mg | ORAL_CAPSULE | Freq: Every day | ORAL | 0 refills | Status: DC

## 2021-11-27 NOTE — ED Notes (Signed)
Pt stated that she could not keep the cardiac monitoring on because she can not stay still at all.

## 2021-11-27 NOTE — ED Provider Notes (Signed)
[facility] EMERGENCY DEPARTMENT Provider Note   CSN: [id number] Arrival date & time: 11/27/21  0755     History  Chief Complaint  Patient presents with   Flank Pain    Megan Barton is a 31 y.o. female.  31 year old female with a history of gallstones and kidney stones with prior C-section who presents with left-sided flank pain. Yesterday diffuse myalgias, headache and went to bed. At 0500 she says that the headache was going and she started having severe flank pain. Constant. Better with movement. Radiates from her L back to anterior abdomen. Describes it as sharp stabbing pain. CT showed gallstones and kidney stones on recent visit.  Nauseous with 1 episode of nonbloody nonbilious emesis.  Denies any diarrhea.  Does have urinary urgency.   Flank Pain       Home Medications Prior to Admission medications   Medication Sig Start Date End Date Taking? Authorizing Provider  ibuprofen (ADVIL) 600 MG tablet Take 1 tablet (600 mg total) by mouth every 6 (six) hours as needed for headache, mild pain, moderate pain or cramping. 05/05/20  Yes Harold Hedge, MD  tamsulosin (FLOMAX) 0.4 MG CAPS capsule Take 1 capsule (0.4 mg total) by mouth daily. 11/27/21  Yes Rondel Baton, MD      Allergies    Keflex [cephalexin]    Review of Systems   Review of Systems  Constitutional:  Positive for chills. Negative for fever.  Gastrointestinal:  Positive for nausea and vomiting. Negative for constipation and diarrhea.  Genitourinary:  Positive for flank pain and urgency. Negative for dysuria and frequency.    Physical Exam Updated Vital Signs BP 116/62 (BP Location: Right Arm)   Pulse 71   Temp 98.1 F (36.7 C) (Oral)   Resp 16   Ht 5\' 10"  (1.778 m)   Wt 122.5 kg   LMP 11/27/2021 (Exact Date) Comment: IUD removed in July2023  SpO2 98%   BMI 38.74 kg/m  Physical Exam Vitals and nursing note reviewed.  Constitutional:      General: She is not in acute distress.     Appearance: She is well-developed.  HENT:     Head: Normocephalic and atraumatic.     Right Ear: External ear normal.     Left Ear: External ear normal.     Nose: Nose normal.     Mouth/Throat:     Mouth: Mucous membranes are moist.     Pharynx: Oropharynx is clear.  Eyes:     Extraocular Movements: Extraocular movements intact.     Conjunctiva/sclera: Conjunctivae normal.     Pupils: Pupils are equal, round, and reactive to light.  Cardiovascular:     Rate and Rhythm: Normal rate and regular rhythm.     Heart sounds: No murmur heard. Pulmonary:     Effort: Pulmonary effort is normal. No respiratory distress.     Breath sounds: Normal breath sounds.  Abdominal:     General: Abdomen is flat. There is no distension.     Palpations: Abdomen is soft. There is no mass.     Tenderness: There is abdominal tenderness (Left mid abdomen). There is no right CVA tenderness, left CVA tenderness or guarding.  Musculoskeletal:        General: No swelling.     Cervical back: Normal range of motion and neck supple.     Right lower leg: No edema.     Left lower leg: No edema.  Skin:    General: Skin is warm  and dry.     Capillary Refill: Capillary refill takes less than 2 seconds.  Neurological:     Mental Status: She is alert and oriented to person, place, and time. Mental status is at baseline.  Psychiatric:        Mood and Affect: Mood normal.     ED Results / Procedures / Treatments   Labs (all labs ordered are listed, but only abnormal results are displayed) Labs Reviewed  URINALYSIS, ROUTINE W REFLEX MICROSCOPIC - Abnormal; Notable for the following components:      Result Value   APPearance HAZY (*)    Specific Gravity, Urine 1.031 (*)    Hgb urine dipstick MODERATE (*)    Protein, ur 30 (*)    All other components within normal limits  BASIC METABOLIC PANEL - Abnormal; Notable for the following components:   Glucose, Bld 128 (*)    Calcium 8.7 (*)    All other components  within normal limits  CBC WITH DIFFERENTIAL/PLATELET  POC URINE PREG, ED    EKG None  Radiology CT ABDOMEN PELVIS WO CONTRAST  Result Date: 11/27/2021 CLINICAL DATA:  Left flank pain EXAM: CT ABDOMEN AND PELVIS WITHOUT CONTRAST TECHNIQUE: Multidetector CT imaging of the abdomen and pelvis was performed following the standard protocol without IV contrast. RADIATION DOSE REDUCTION: This exam was performed according to the departmental dose-optimization program which includes automated exposure control, adjustment of the mA and/or kV according to patient size and/or use of iterative reconstruction technique. COMPARISON:  11/13/2021 FINDINGS: Lower chest: No acute abnormality. Hepatobiliary: Unremarkable unenhanced appearance of the liver. No focal liver lesion identified. Cholelithiasis. No pericholecystic inflammatory changes by CT. No biliary dilatation. Pancreas: Unremarkable. No pancreatic ductal dilatation or surrounding inflammatory changes. Spleen: Normal in size without focal abnormality. Adrenals/Urinary Tract: Unremarkable adrenal glands. Obstructing 6 mm stone at the level of the left ureterovesical junction resulting in moderate left-sided hydroureteronephrosis. Additional tiny punctate 1-2 mm calculi within both kidneys. No right-sided hydronephrosis. No right ureteral calculi. Urinary bladder is decompressed. Stomach/Bowel: Stomach is within normal limits. Appendix appears normal. No evidence of bowel wall thickening, distention, or inflammatory changes. Vascular/Lymphatic: No significant vascular findings are present. No enlarged abdominal or pelvic lymph nodes. Reproductive: Uterus and bilateral adnexa are unremarkable. Other: No free fluid. No abdominopelvic fluid collection. No pneumoperitoneum. No abdominal wall hernia. Musculoskeletal: No acute or significant osseous findings. Degenerative disc disease at L5-S1. IMPRESSION: 1. Obstructing 6 mm stone at the level of the left ureterovesical  junction resulting in moderate left-sided hydroureteronephrosis. 2. Additional tiny punctate 1-2 mm calculi within both kidneys. 3. Cholelithiasis. Electronically Signed   By: Duanne Guess D.O.   On: 11/27/2021 10:36    Procedures Procedures   Medications Ordered in ED Medications  ketorolac (TORADOL) 30 MG/ML injection 15 mg (15 mg Intravenous Given 11/27/21 0917)  ondansetron (ZOFRAN) injection 4 mg (4 mg Intravenous Given 11/27/21 0920)  lactated ringers bolus 1,000 mL (0 mLs Intravenous Stopped 11/27/21 1105)    ED Course/ Medical Decision Making/ A&P Clinical Course as of 11/27/21 1818  Tue Nov 27, 2021  1034 CT reviewed and interpreted by me as showing obstructing stone in the left distal ureter with mild hydronephrosis. [RP]    Clinical Course User Index [RP] Rondel Baton, MD                           Medical Decision Making Amount and/or Complexity of Data Reviewed Labs: ordered.  Radiology: ordered.  Risk Prescription drug management.   31 year old female with a history of gallstones and kidney stones with prior C-section who presents with left-sided flank pain.  Initial DDx: Kidney stone, MSK pain  Plan:  Labs hCG Toradol Zofran CT kidney stone protocol  ED Summary:  Patient underwent the above work-up which showed that she did have a 6 mm obstructing kidney stone in her distal ureter with hydronephrosis.  Labs not show evidence of AKI.  Patient's pain was able to be controlled with Toradol.  Given the size and location of the stone and the fact that her urinalysis was not reflective of a urinary tract infection feel that she is suitable for outpatient follow-up.  Patient was given a strainer and instructed to follow-up with urology as soon as possible and also given Tylenol and Motrin as needed for symptoms.  Return precautions discussed prior to discharge.  Records reviewed Care Everywhere/External Records  The following labs were independently  interpreted: UA not cw UTI  I independently visualized the following imaging with scope of interpretation limited to determining acute life threatening conditions related to emergency care: CT abdomen pelvis, which revealed left-sided hydroureter and hydronephrosis   Final Clinical Impression(s) / ED Diagnoses Final diagnoses:  Nephrolithiasis  Kidney stone    Rx / DC Orders ED Discharge Orders          Ordered    tamsulosin (FLOMAX) 0.4 MG CAPS capsule  Daily        11/27/21 1044              Rondel Baton, MD 11/27/21 1819

## 2021-11-27 NOTE — Discharge Instructions (Signed)
Today you were seen in the emergency department for your flank pain.    In the emergency department you had a CT scan which showed that you have a kidney stone that is 78mm and close to your bladder.    At home, please take the Flomax we have prescribed you to help the kidney stone pass as well as Tylenol and Motrin for any pain that may have.    Follow-up with your primary doctor in 2-3 days regarding your visit.  Please also strain your urine until the kidney stone passes and follow-up with your urologist about this.  Please place the kidney stone in the Ziploc bag and take it to that appointment.  Return immediately to the emergency department if you experience any of the following: Severe pain, fevers, or any other concerning symptoms.    Thank you for visiting our Emergency Department. It was a pleasure taking care of you today.

## 2021-11-27 NOTE — ED Triage Notes (Signed)
Pt c/o L sided flank pain and malaise that woke her from her sleep at 0500. Pt also endorses one episode of emesis PTA. Pt was recently seen and diagnosed with kidney stones and gall stones. Pt endorses polyuria. Menstrual cycle began today.

## 2021-11-28 ENCOUNTER — Ambulatory Visit (INDEPENDENT_AMBULATORY_CARE_PROVIDER_SITE_OTHER): Payer: Commercial Managed Care - PPO | Admitting: Urology

## 2021-11-28 ENCOUNTER — Encounter: Payer: Self-pay | Admitting: Urology

## 2021-11-28 VITALS — BP 123/86 | HR 86 | Ht 70.0 in | Wt 270.0 lb

## 2021-11-28 DIAGNOSIS — N201 Calculus of ureter: Secondary | ICD-10-CM

## 2021-11-28 DIAGNOSIS — N200 Calculus of kidney: Secondary | ICD-10-CM

## 2021-11-28 DIAGNOSIS — N1330 Unspecified hydronephrosis: Secondary | ICD-10-CM

## 2021-11-28 MED ORDER — HYDROCODONE-ACETAMINOPHEN 5-325 MG PO TABS: 1.0000 | ORAL_TABLET | Freq: Four times a day (QID) | ORAL | 0 refills | Status: DC | PRN

## 2021-11-28 NOTE — Progress Notes (Signed)
Assessment: 1. Ureteral calculus; left, distal   2. Nephrolithiasis    Plan: I personally reviewed the CT studies from 11/13/2021 and 11/27/2021 showing a distal left ureteral calculus. Diagnosis and treatment options for a ureteral calculus including spontaneous stone passage, shockwave lithotripsy, and ureteroscopic stone manipulation were discussed with [facility] in detail. She has elected to attempt to pass the ureteral calculus. Strain urine, Pain medication as needed.  Rx provided., and Alpha blocker for medical expulsive therapy.  Off label use discussed.  Use and side effects reviewed.  Rx provided. Follow-up in 1 weeks with KUB. Return to office or ER for uncontrolled pain, vomiting, fever, or chills.  Chief Complaint:  Chief Complaint  Patient presents with   ureteral calculus    History of Present Illness:  Megan Barton is a 31 y.o. female who is seen in consultation from [place], [place] for evaluation of left ureteral calculus.  She initially presented to the emergency room on 11/13/2021 with right-sided flank and abdominal pain with associated nausea and vomiting.  CT imaging at that time showed a small bilateral renal stones, no significant hydronephrosis, and a 6 mm calcification in the area of the left distal ureter close to the UVJ.  She again presented to the emergency room on 11/27/2021 with left-sided flank pain.  Repeat CT imaging demonstrated the 6 mm calculus at the left UVJ with moderate left-sided hydronephrosis and tiny bilateral renal calculi.  She was treated with Toradol which relieved her pain.  She was discharged with Flomax. She has not had any pain today.  She has noted gross hematuria.  No fevers or chills.  She is straining her urine and is not aware of passing a stone.  Past Medical History:  Past Medical History:  Diagnosis Date   Anemia    Heartburn in pregnancy    zantac   Obesity 01/06/2013   UTI (lower urinary tract infection)  01/06/2013   resolved    Past Surgical History:  Past Surgical History:  Procedure Laterality Date   CESAREAN SECTION N/A 02/10/2014   Procedure: CESAREAN SECTION;  Surgeon: Mitchel Honour, DO;  Location: [facility];  Service: Obstetrics;  Laterality: N/A;  Primary edc 02/09/14   CESAREAN SECTION N/A 09/29/2017   Procedure: REPEAT CESAREAN SECTION;  Surgeon: Mitchel Honour, DO;  Location: [facility];  Service: Obstetrics;  Laterality: N/A;  Repeat edc 7/5 allergy to Keflex need RNFA   CESAREAN SECTION N/A 05/03/2020   Procedure: CESAREAN SECTION;  Surgeon: Candice Camp, MD;  Location: [facility];  Service: Obstetrics;  Laterality: N/A;   WISDOM TOOTH EXTRACTION      Allergies:  Allergies  Allergen Reactions   Keflex [Cephalexin] Rash    Family History:  Family History  Problem Relation Age of Onset   Diabetes Mother    Depression Mother    Hypertension Father    Hyperlipidemia Father    Asthma Brother    Cancer Paternal Aunt        breast   Cancer Paternal Grandfather        colon, liver    Social History:  Social History   Tobacco Use   Smoking status: Never   Smokeless tobacco: Never  Vaping Use   Vaping Use: Never used  Substance Use Topics   Alcohol use: No   Drug use: No    Review of symptoms:  Constitutional:  Negative for unexplained weight loss, night sweats, fever, chills ENT:  Negative for  nose bleeds, sinus pain, painful swallowing CV:  Negative for chest pain, shortness of breath, exercise intolerance, palpitations, loss of consciousness Resp:  Negative for cough, wheezing, shortness of breath GI:  Negative for diarrhea, bloody stools GU:  Positives noted in HPI; otherwise negative for urinary incontinence Neuro:  Negative for seizures, poor balance, limb weakness, slurred speech Psych:  Negative for lack of energy, depression, anxiety Endocrine:  Negative for polydipsia, polyuria, symptoms of hypoglycemia (dizziness, hunger, sweating) Hematologic:   Negative for anemia, purpura, petechia, prolonged or excessive bleeding, use of anticoagulants  Allergic:  Negative for difficulty breathing or choking as a result of exposure to anything; no shellfish allergy; no allergic response (rash/itch) to materials, foods  Physical exam: BP 123/86   Pulse 86   Ht 5\' 10"  (1.778 m)   Wt 270 lb (122.5 kg)   LMP 11/27/2021 (Exact Date) Comment: IUD removed in July2023  BMI 38.74 kg/m  GENERAL APPEARANCE:  Well appearing, well developed, well nourished, NAD HEENT: Atraumatic, Normocephalic, oropharynx clear. NECK: Supple without lymphadenopathy or thyromegaly. LUNGS: Clear to auscultation bilaterally. HEART: Regular Rate and Rhythm without murmurs, gallops, or rubs. ABDOMEN: Soft, non-tender, No Masses. EXTREMITIES: Moves all extremities well.  Without clubbing, cyanosis, or edema. NEUROLOGIC:  Alert and oriented x 3, normal gait, CN II-XII grossly intact.  MENTAL STATUS:  Appropriate.  BACK:  Non-tender to palpation.  No CVAT SKIN:  Warm, dry and intact.    Results: U/A: 6-10 WBCs, >30 RBCs, few bacteria, nitrite negative

## 2021-11-29 LAB — URINALYSIS, ROUTINE W REFLEX MICROSCOPIC
Bilirubin, UA: NEGATIVE
Glucose, UA: NEGATIVE
Ketones, UA: NEGATIVE
Leukocytes,UA: NEGATIVE
Nitrite, UA: NEGATIVE
Specific Gravity, UA: 1.025 (ref 1.005–1.030)
Urobilinogen, Ur: 0.2 mg/dL (ref 0.2–1.0)
pH, UA: 5 (ref 5.0–7.5)

## 2021-11-29 LAB — MICROSCOPIC EXAMINATION
RBC, Urine: >30 /hpf — AB (ref 0–2)
Renal Epithel, UA: NONE SEEN /hpf

## 2021-12-04 ENCOUNTER — Ambulatory Visit (HOSPITAL_COMMUNITY)
Admission: RE | Admit: 2021-12-04 | Discharge: 2021-12-04 | Disposition: A | Discharge status: Home / Self Care | Payer: Commercial Managed Care - PPO | Source: Ambulatory Visit | Attending: Urology | Admitting: Urology

## 2021-12-04 DIAGNOSIS — N201 Calculus of ureter: Secondary | ICD-10-CM | POA: Insufficient documentation

## 2021-12-05 ENCOUNTER — Encounter: Payer: Self-pay | Admitting: Urology

## 2021-12-05 ENCOUNTER — Ambulatory Visit (INDEPENDENT_AMBULATORY_CARE_PROVIDER_SITE_OTHER): Payer: Commercial Managed Care - PPO | Admitting: Urology

## 2021-12-05 VITALS — BP 131/83 | HR 92

## 2021-12-05 DIAGNOSIS — N200 Calculus of kidney: Secondary | ICD-10-CM

## 2021-12-05 DIAGNOSIS — N201 Calculus of ureter: Secondary | ICD-10-CM | POA: Diagnosis not present

## 2021-12-05 LAB — URINALYSIS, ROUTINE W REFLEX MICROSCOPIC
Bilirubin, UA: NEGATIVE
Glucose, UA: NEGATIVE
Ketones, UA: NEGATIVE
Leukocytes,UA: NEGATIVE
Nitrite, UA: NEGATIVE
Protein,UA: NEGATIVE
Specific Gravity, UA: >1.03 — ABNORMAL HIGH (ref 1.005–1.030)
Urobilinogen, Ur: 0.2 mg/dL (ref 0.2–1.0)
pH, UA: 5.5 (ref 5.0–7.5)

## 2021-12-05 LAB — MICROSCOPIC EXAMINATION
Bacteria, UA: NONE SEEN
Epithelial Cells (non renal): NONE SEEN /hpf (ref 0–10)
Renal Epithel, UA: NONE SEEN /hpf
WBC, UA: NONE SEEN /hpf (ref 0–5)

## 2021-12-05 NOTE — Progress Notes (Signed)
Assessment: 1. Ureteral calculus; left, distal   2. Nephrolithiasis     Plan: I personally reviewed the KUB from 12/04/2021 showing a 4 mm calcification in the left pelvis consistent with a distal ureteral calculus. I discussed management options for the distal ureteral calculus with the patient today including continued spontaneous passage, shockwave lithotripsy, and ureteroscopic stone manipulation. She would like to schedule for shockwave lithotripsy. Continue tamsulosin and straining her urine in the meantime. Pain medication as needed. Return for follow-up on 12/14/2021 with KUB.  Procedure: The patient will be scheduled for left ESL at [facility].  Surgical request is placed with the surgery schedulers and will be scheduled at the patient's/family request. Informed consent is given as documented below. Anesthesia:  IV sedation  The patient does not  have sleep apnea, history of MRSA, history of VRE, history of cardiac device requiring special anesthetic needs. Patient is stable and considered clear for surgical in an outpatient ambulatory surgery setting as well as patient hospital setting.  Consent for Operation or Procedure: Provider Certification I hereby certify that the nature, purpose, benefits, usual and most frequent risks of, and alternatives to, the operation or procedure have been explained to the patient (or person authorized to sign for the patient) either by me as responsible physician or by the provider who is to perform the operation or procedure. Time spent such that the patient/family has had an opportunity to ask questions, and that those questions have been answered. The patient or the patient's representative has been advised that selected tasks may be performed by assistants to the primary health care provider(s). I believe that the patient (or person authorized to sign for the patient) understands what has been explained, and has consented to the operation or  procedure. No guarantees were implied or made.  Chief Complaint:  Chief Complaint  Patient presents with   ureteral calculus    History of Present Illness:  Megan Barton is a 31 y.o. female who is seen for further evaluation of left ureteral calculus.  She initially presented to the emergency room on 11/13/2021 with right-sided flank and abdominal pain with associated nausea and vomiting.  CT imaging at that time showed a small bilateral renal stones, no significant hydronephrosis, and a 6 mm calcification in the area of the left distal ureter close to the UVJ.  She again presented to the emergency room on 11/27/2021 with left-sided flank pain.  Repeat CT imaging demonstrated the 6 mm calculus at the left UVJ with moderate left-sided hydronephrosis and tiny bilateral renal calculi.  She was treated with Toradol which relieved her pain.  She was discharged with Flomax. At the time of her visit on 11/28/2021, she was not having any pain.  She was unaware of passing a stone but had not been straining her urine on a regular basis.  She returns today for follow-up. KUB from 12/04/2021 shows a 4 mm calcification in the left pelvis. She passed a very small stone fragment.  She has had only minimal left-sided flank pain since her last visit.  No dysuria or gross hematuria.  No fevers or chills.  She continues on tamsulosin.  Portions of the above documentation were copied from a prior visit for review purposes only.   Past Medical History:  Past Medical History:  Diagnosis Date   Anemia    Heartburn in pregnancy    zantac   Obesity 01/06/2013   UTI (lower urinary tract infection) 01/06/2013   resolved  Past Surgical History:  Past Surgical History:  Procedure Laterality Date   CESAREAN SECTION N/A 02/10/2014   Procedure: CESAREAN SECTION;  Surgeon: Mitchel Honour, DO;  Location: [facility];  Service: Obstetrics;  Laterality: N/A;  Primary edc 02/09/14   CESAREAN SECTION N/A 09/29/2017   Procedure:  REPEAT CESAREAN SECTION;  Surgeon: Mitchel Honour, DO;  Location: [facility];  Service: Obstetrics;  Laterality: N/A;  Repeat edc 7/5 allergy to Keflex need RNFA   CESAREAN SECTION N/A 05/03/2020   Procedure: CESAREAN SECTION;  Surgeon: Candice Camp, MD;  Location: [facility];  Service: Obstetrics;  Laterality: N/A;   WISDOM TOOTH EXTRACTION      Allergies:  Allergies  Allergen Reactions   Keflex [Cephalexin] Rash    Family History:  Family History  Problem Relation Age of Onset   Diabetes Mother    Depression Mother    Hypertension Father    Hyperlipidemia Father    Asthma Brother    Cancer Paternal Aunt        breast   Cancer Paternal Grandfather        colon, liver    Social History:  Social History   Tobacco Use   Smoking status: Never   Smokeless tobacco: Never  Vaping Use   Vaping Use: Never used  Substance Use Topics   Alcohol use: No   Drug use: No    ROS: Constitutional:  Negative for fever, chills, weight loss CV: Negative for chest pain, previous MI, hypertension Respiratory:  Negative for shortness of breath, wheezing, sleep apnea, frequent cough GI:  Negative for nausea, vomiting, bloody stool, GERD  Physical exam: BP 131/83   Pulse 92   LMP 11/27/2021 (Exact Date) Comment: IUD removed in July2023 GENERAL APPEARANCE:  Well appearing, well developed, well nourished, NAD HEENT:  Atraumatic, normocephalic, oropharynx clear NECK:  Supple without lymphadenopathy or thyromegaly ABDOMEN:  Soft, non-tender, no masses EXTREMITIES:  Moves all extremities well, without clubbing, cyanosis, or edema NEUROLOGIC:  Alert and oriented x 3, normal gait, CN II-XII grossly intact MENTAL STATUS:  appropriate BACK:  Non-tender to palpation, No CVAT SKIN:  Warm, dry, and intact  Results: U/A: 0-2 RBCs.

## 2021-12-11 ENCOUNTER — Ambulatory Visit (INDEPENDENT_AMBULATORY_CARE_PROVIDER_SITE_OTHER): Payer: Commercial Managed Care - PPO | Admitting: General Surgery

## 2021-12-11 ENCOUNTER — Other Ambulatory Visit: Payer: Self-pay

## 2021-12-11 ENCOUNTER — Encounter: Payer: Self-pay | Admitting: General Surgery

## 2021-12-11 ENCOUNTER — Other Ambulatory Visit: Payer: Self-pay | Admitting: *Deleted

## 2021-12-11 VITALS — BP 124/84 | HR 82 | Temp 98.3°F | Resp 18 | Ht 70.0 in | Wt 275.0 lb

## 2021-12-11 DIAGNOSIS — K8020 Calculus of gallbladder without cholecystitis without obstruction: Secondary | ICD-10-CM | POA: Diagnosis not present

## 2021-12-11 DIAGNOSIS — N201 Calculus of ureter: Secondary | ICD-10-CM

## 2021-12-11 DIAGNOSIS — K589 Irritable bowel syndrome without diarrhea: Secondary | ICD-10-CM

## 2021-12-11 NOTE — Progress Notes (Unsigned)
[facility] History and Physical  Reason for Referral:*** Referring Physician: ***  Chief Complaint   New Patient (Initial Visit)     Megan Barton is a 31 y.o. female.  HPI: ***.  The *** started *** and has had a duration of ***.  It is associated with ***.  The *** is improved with ***, and is made worse with ***.    Quality*** Context***  Past Medical History:  Diagnosis Date   Anemia    Heartburn in pregnancy    zantac   Obesity 01/06/2013   UTI (lower urinary tract infection) 01/06/2013   resolved    Past Surgical History:  Procedure Laterality Date   CESAREAN SECTION N/A 02/10/2014   Procedure: CESAREAN SECTION;  Surgeon: Mitchel Honour, DO;  Location: [facility];  Service: Obstetrics;  Laterality: N/A;  Primary edc 02/09/14   CESAREAN SECTION N/A 09/29/2017   Procedure: REPEAT CESAREAN SECTION;  Surgeon: Mitchel Honour, DO;  Location: [facility];  Service: Obstetrics;  Laterality: N/A;  Repeat edc 7/5 allergy to Keflex need RNFA   CESAREAN SECTION N/A 05/03/2020   Procedure: CESAREAN SECTION;  Surgeon: Candice Camp, MD;  Location: [facility];  Service: Obstetrics;  Laterality: N/A;   WISDOM TOOTH EXTRACTION      Family History  Problem Relation Age of Onset   Diabetes Mother    Depression Mother    Hypertension Father    Hyperlipidemia Father    Asthma Brother    Cancer Paternal Aunt        breast   Cancer Paternal Grandfather        colon, liver    Social History   Tobacco Use   Smoking status: Never   Smokeless tobacco: Never  Vaping Use   Vaping Use: Never used  Substance Use Topics   Alcohol use: No   Drug use: No    Medications: {medication reviewed/display:3041432} Allergies as of 12/11/2021       Reactions   Keflex [cephalexin] Rash        Medication List        Accurate as of December 11, 2021 11:54 AM. If you have any questions, ask your nurse or doctor.          acetaminophen 500 MG tablet Commonly  known as: TYLENOL Take 500 mg by mouth every 6 (six) hours as needed.   HYDROcodone-acetaminophen 5-325 MG tablet Commonly known as: NORCO/VICODIN Take 1 tablet by mouth every 6 (six) hours as needed for moderate pain.   ibuprofen 600 MG tablet Commonly known as: ADVIL Take 1 tablet (600 mg total) by mouth every 6 (six) hours as needed for headache, mild pain, moderate pain or cramping.   tamsulosin 0.4 MG Caps capsule Commonly known as: Flomax Take 1 capsule (0.4 mg total) by mouth daily.         ROS:  {Review of Systems:30496}  Blood pressure 124/84, pulse 82, temperature 98.3 F (36.8 C), temperature source Oral, resp. rate 18, height 5\' 10"  (1.778 m), weight 275 lb (124.7 kg), last menstrual period 11/27/2021, SpO2 98 %, not currently breastfeeding. Physical Exam  Results: CLINICAL DATA:  Left flank pain   EXAM: CT ABDOMEN AND PELVIS WITHOUT CONTRAST   TECHNIQUE: Multidetector CT imaging of the abdomen and pelvis was performed following the standard protocol without IV contrast.   RADIATION DOSE REDUCTION: This exam was performed according to the departmental dose-optimization program which includes automated exposure control, adjustment of the mA and/or kV  according to patient size and/or use of iterative reconstruction technique.   COMPARISON:  11/13/2021   FINDINGS: Lower chest: No acute abnormality.   Hepatobiliary: Unremarkable unenhanced appearance of the liver. No focal liver lesion identified. Cholelithiasis. No pericholecystic inflammatory changes by CT. No biliary dilatation.   Pancreas: Unremarkable. No pancreatic ductal dilatation or surrounding inflammatory changes.   Spleen: Normal in size without focal abnormality.   Adrenals/Urinary Tract: Unremarkable adrenal glands. Obstructing 6 mm stone at the level of the left ureterovesical junction resulting in moderate left-sided hydroureteronephrosis. Additional tiny punctate 1-2 mm calculi  within both kidneys. No right-sided hydronephrosis. No right ureteral calculi. Urinary bladder is decompressed.   Stomach/Bowel: Stomach is within normal limits. Appendix appears normal. No evidence of bowel wall thickening, distention, or inflammatory changes.   Vascular/Lymphatic: No significant vascular findings are present. No enlarged abdominal or pelvic lymph nodes.   Reproductive: Uterus and bilateral adnexa are unremarkable.   Other: No free fluid. No abdominopelvic fluid collection. No pneumoperitoneum. No abdominal wall hernia.   Musculoskeletal: No acute or significant osseous findings. Degenerative disc disease at L5-S1.   IMPRESSION: 1. Obstructing 6 mm stone at the level of the left ureterovesical junction resulting in moderate left-sided hydroureteronephrosis. 2. Additional tiny punctate 1-2 mm calculi within both kidneys. 3. Cholelithiasis.     Electronically Signed   By: Duanne Guess D.O.   On: 11/27/2021 10:36    Assessment & Plan:  [name] is a 32 y.o. female with *** -*** -*** -Follow up ***  All questions were answered to the satisfaction of the patient and family***.  The risk and benefits of *** were discussed including but not limited to ***.  After careful consideration, [name] has decided to ***.    Lucretia Roers 12/11/2021, 11:54 AM

## 2021-12-11 NOTE — Patient Instructions (Signed)
Minimally Invasive Cholecystectomy Minimally invasive cholecystectomy is surgery to remove the gallbladder. The gallbladder is a pear-shaped organ that lies beneath the liver on the right side of the body. The gallbladder stores bile, which is a fluid that helps the body digest fats. Cholecystectomy is often done to treat inflammation (irritation and swelling) of the gallbladder (cholecystitis). This condition is usually caused by a buildup of gallstones (cholelithiasis) in the gallbladder or when the fluid in the gall bladder becomes stagnant because gallstones get stuck in the ducts (tubes) and block the flow of bile. This can result in inflammation and pain. In severe cases, emergency surgery may be required. This procedure is done through small incisions in the abdomen, instead of one large incision. It is also called laparoscopic surgery. A thin scope with a camera (laparoscope) is inserted through one incision. Then surgical instruments are inserted through the other incisions. In some cases, a minimally invasive surgery may need to be changed to a surgery that is done through a larger incision. This is called open surgery. Tell a health care provider about: Any allergies you have. All medicines you are taking, including vitamins, herbs, eye drops, creams, and over-the-counter medicines. Any problems you or family members have had with anesthetic medicines. Any bleeding problems you have. Any surgeries you have had. Any medical conditions you have. Whether you are pregnant or may be pregnant. What are the risks? Generally, this is a safe procedure. However, problems may occur, including: Infection. Bleeding. Allergic reactions to medicines. Damage to nearby structures or organs. A gallstone remaining in the common bile duct. The common bile duct carries bile from the gallbladder to the small intestine. A bile leak from the liver or cystic duct after your gallbladder is removed. What happens  before the procedure? Medicines Ask your health care provider about: Changing or stopping your regular medicines. This is especially important if you are taking diabetes medicines or blood thinners. Taking medicines such as aspirin and ibuprofen. These medicines can thin your blood. Do not take these medicines unless your health care provider tells you to take them. Taking over-the-counter medicines, vitamins, herbs, and supplements. General instructions If you will be going home right after the procedure, plan to have a responsible adult: Take you home from the hospital or clinic. You will not be allowed to drive. Care for you for the time you are told. Do not use any products that contain nicotine or tobacco for at least 4 weeks before the procedure. These products include cigarettes, chewing tobacco, and vaping devices, such as e-cigarettes. If you need help quitting, ask your health care provider. Ask your health care provider: How your surgery site will be marked. What steps will be taken to help prevent infection. These may include: Removing hair at the surgery site. Washing skin with a germ-killing soap. Taking antibiotic medicine. What happens during the procedure?  An IV will be inserted into one of your veins. You will be given one or both of the following: A medicine to help you relax (sedative). A medicine to make you fall asleep (general anesthetic). Your surgeon will make several small incisions in your abdomen. The laparoscope will be inserted through one of the small incisions. The camera on the laparoscope will send images to a monitor in the operating room. This lets your surgeon see inside your abdomen. A gas will be pumped into your abdomen. This will expand your abdomen to give the surgeon more room to perform the surgery. Other tools that   are needed for the procedure will be inserted through the other incisions. The gallbladder will be removed through one of the  incisions. Your common bile duct may be examined. If stones are found in the common bile duct, they may be removed. After your gallbladder has been removed, the incisions will be closed with stitches (sutures), staples, or skin glue. Your incisions will be covered with a bandage (dressing). The procedure may vary among health care providers and hospitals. What happens after the procedure? Your blood pressure, heart rate, breathing rate, and blood oxygen level will be monitored until you leave the hospital or clinic. You will be given medicines as needed to control your pain. You may have a drain placed in the incision. The drain will be removed a day or two after the procedure. Summary Minimally invasive cholecystectomy, also called laparoscopic cholecystectomy, is surgery to remove the gallbladder using small incisions. Tell your health care provider about all the medical conditions you have and all the medicines you are taking for those conditions. Before the procedure, follow instructions about when to stop eating and drinking and changing or stopping medicines. Plan to have a responsible adult care for you for the time you are told after you leave the hospital or clinic. This information is not intended to replace advice given to you by your health care provider. Make sure you discuss any questions you have with your health care provider. Document Revised: 09/26/2020 Document Reviewed: 09/26/2020 Elsevier Patient Education  2023 [employer] Cholecystitis  Cholecystitis is inflammation of the gallbladder. Cholecystitis is often called a gallbladder attack. The gallbladder is a pear-shaped organ that lies beneath the liver on the right side of the body. The gallbladder stores a fluid that helps the body digest fats (bile). If bile builds up in your gallbladder, your gallbladder becomes inflamed and can develop a serious infection. This condition may occur suddenly. Cholecystitis is a serious  condition and requires treatment. What are the causes? The most common cause of this condition is gallstones. Gallstones can block the tube (duct) that carries bile out of your gallbladder. This causes bile to build up. Other causes include: Damage to the gallbladder due to decreased blood flow. Infection in the bile duct. Scars, kinks, or adhesions in the bile duct. Tumors in the liver, pancreas, or gallbladder. What increases the risk? You are more likely to develop this condition if: You are female and between the ages of 55-62. You take birth control pills or use estrogen. You take certain medicines that increase your likelihood of developing gallstones. You are obese. You have a severe reaction to an infection (sepsis). The infection may be bacterial, fungal, parasitic, or viral. You have been hospitalized due to trauma, such as a burn or critical illness. You have not eaten or drank for a long period of time (prolonged fasting). What are the signs or symptoms? Symptoms of this condition include: Tenderness in the upper right part of the abdomen. A lump over the gallbladder. Bloating in the abdomen. Nausea. Vomiting. Fever. Chills. How is this diagnosed? This condition is diagnosed with a medical history and physical exam. You may also have other tests, including: Imaging tests, such as: An ultrasound of the abdomen. A CT scan of the abdomen. A gallbladder nuclear scan (HIDA cholescintigraphy). This allows your health care provider to see the bile moving from your liver to your gallbladder and to your small intestine. MRI. Blood tests, such as: A complete blood count. The white blood cell count  may be higher than normal. C-reactive protein (CRP) test. The level of CRP will be higher if there is an infection. Liver function tests. Certain types of gallstones cause some results to be higher than normal. How is this treated? Treatment may include: Pain medicine and IV  fluids. Not eating or drinking (fasting). This helps to take stress off of your gallbladder. Antibiotic medicine. This is usually given through an IV. Surgery to remove your gallbladder (cholecystectomy). Gallbladder drainage. In this procedure, a tube is placed into the gallbladder to drain fluid. This may be done for people with moderate to severe cholecystitis who cannot have surgery. Follow these instructions at home: Medicines  Take over-the-counter and prescription medicines only as told by your health care provider. If you were prescribed an antibiotic medicine, take it as told by your health care provider. Do not stop taking the antibiotic even if you start to feel better. General instructions Follow instructions from your health care provider about what to eat or drink. When you are allowed to eat, avoid eating or drinking anything that triggers your symptoms. Do not use any products that contain nicotine or tobacco. These products include cigarettes, chewing tobacco, and vaping devices, such as e-cigarettes. If you need help quitting, ask your health care provider. Keep all follow-up visits. This is important. Contact a health care provider if: Your pain is not controlled with medicine. You have a fever. Get help right away if: Your pain moves to another part of your abdomen or to your back. You continue to have symptoms or you develop new symptoms even with treatment. These symptoms may be an emergency. Get help right away. Call 911. Do not wait to see if the symptoms will go away. Do not drive yourself to the hospital. Summary Cholecystitis is inflammation of the gallbladder. The most common cause of this condition is gallstones. Gallstones can block the tube (duct) that carries bile out of your gallbladder. Common symptoms include tenderness in the abdomen, nausea, vomiting, fever, and chills. This condition is treated with fasting, pain medicine, surgery to remove the  gallbladder, antibiotic medicines, and gallbladder drainage. Follow your health care provider's instructions for eating and drinking. Avoid eating anything that triggers your symptoms. This information is not intended to replace advice given to you by your health care provider. Make sure you discuss any questions you have with your health care provider. Document Revised: 09/26/2020 Document Reviewed: 09/26/2020 Elsevier Patient Education  2023 [employer].

## 2021-12-13 ENCOUNTER — Encounter: Payer: Self-pay | Admitting: *Deleted

## 2021-12-14 ENCOUNTER — Encounter: Payer: Self-pay | Admitting: Urology

## 2021-12-14 ENCOUNTER — Ambulatory Visit (HOSPITAL_COMMUNITY)
Admission: RE | Admit: 2021-12-14 | Discharge: 2021-12-14 | Disposition: A | Discharge status: Home / Self Care | Payer: Commercial Managed Care - PPO | Source: Ambulatory Visit | Attending: Urology | Admitting: Urology

## 2021-12-14 ENCOUNTER — Other Ambulatory Visit: Payer: Self-pay

## 2021-12-14 ENCOUNTER — Ambulatory Visit (INDEPENDENT_AMBULATORY_CARE_PROVIDER_SITE_OTHER): Payer: Commercial Managed Care - PPO | Admitting: Urology

## 2021-12-14 VITALS — BP 126/82 | HR 88

## 2021-12-14 DIAGNOSIS — N201 Calculus of ureter: Secondary | ICD-10-CM | POA: Diagnosis present

## 2021-12-14 DIAGNOSIS — N200 Calculus of kidney: Secondary | ICD-10-CM

## 2021-12-14 LAB — URINALYSIS, ROUTINE W REFLEX MICROSCOPIC
Bilirubin, UA: NEGATIVE
Glucose, UA: NEGATIVE
Ketones, UA: NEGATIVE
Leukocytes,UA: NEGATIVE
Nitrite, UA: NEGATIVE
Protein,UA: NEGATIVE
RBC, UA: NEGATIVE
Specific Gravity, UA: 1.02 (ref 1.005–1.030)
Urobilinogen, Ur: 0.2 mg/dL (ref 0.2–1.0)
pH, UA: 7 (ref 5.0–7.5)

## 2021-12-14 NOTE — Progress Notes (Signed)
/  I spoke with Megan Barton. We have discussed possible surgery dates and 12/18/2021 was agreed upon by all parties. Patient given information about surgery date, what to expect pre-operatively and post operatively.   We discussed that a pre-op nurse will be calling to set up the pre-op visit that will take place prior to surgery. Informed patient that our office will communicate any additional care to be provided after surgery.    Patients questions or concerns were discussed during our call. Advised to call our office should there be any additional information, questions or concerns that arise. Patient verbalized understanding.

## 2021-12-14 NOTE — Patient Instructions (Signed)
Dear Ms. Geisler,   Thank you for choosing [facility] to assist in your urologic care for your upcoming surgery. The following information below includes specific dates and details related to surgery:   The Surgical Procedure you are scheduled to have performed is left extracorporeal shock wave lithotripsy Surgery Date: 12/18/2021   Physician performing the surgery: Dr. Di Kindle    Do not eat or drink after midnight the day before your surgery.    You will need a driver the day of surgery and will not be able to operate heavy machinery for 24 hours after.    Your surgery will be performed at  [facility] [address] [place] [address]   Enter at the Main Entrance and check in at the SAME DAY SURGERY desk.     Pre-Admit Testing Info   Pre- Admit appointments are interview with an anesthesiologist or a pre-operative anesthesia nurse. These appointments are typically completed as an in person visit but can take place over the telephone.  You will be contacted to confirm the date and time window.   Post op appointment- Please go by [facility] Radiology department prior to coming to your office follow up to have an xray completed.    If you have any questions or concerns, please don't hesitate to call the office at [phone]   Thank you,    Alfredo [facility], RN Clinical Surgery Coordinator [facility]

## 2021-12-14 NOTE — Progress Notes (Signed)
Assessment: 1. Ureteral calculus, left   2. Nephrolithiasis     Plan: I personally reviewed the KUB from 12/14/2021 showing a 4 mm calcification in the left pelvis consistent with a distal ureteral calculus. I discussed management options for the distal ureteral calculus with the patient today including continued spontaneous passage, shockwave lithotripsy, and ureteroscopic stone manipulation. Continue tamsulosin and straining her urine  Pain medication as needed.   Procedure: The patient will be scheduled for left ESL at [facility] on 12/18/21.  Surgical request is placed with the surgery schedulers and will be scheduled at the patient's/family request. Informed consent is given as documented below. Anesthesia:  IV sedation  The patient does not  have sleep apnea, history of MRSA, history of VRE, history of cardiac device requiring special anesthetic needs. Patient is stable and considered clear for surgical in an outpatient ambulatory surgery setting as well as patient hospital setting.  Consent for Operation or Procedure: Provider Certification I hereby certify that the nature, purpose, benefits, usual and most frequent risks of, and alternatives to, the operation or procedure have been explained to the patient (or person authorized to sign for the patient) either by me as responsible physician or by the provider who is to perform the operation or procedure. Time spent such that the patient/family has had an opportunity to ask questions, and that those questions have been answered. The patient or the patient's representative has been advised that selected tasks may be performed by assistants to the primary health care provider(s). I believe that the patient (or person authorized to sign for the patient) understands what has been explained, and has consented to the operation or procedure. No guarantees were implied or made.  Chief Complaint:  Chief Complaint  Patient presents with    ureteral calculus    History of Present Illness:  Megan Barton is a 31 y.o. female who is seen for further evaluation of left ureteral calculus.  She initially presented to the emergency room on 11/13/2021 with right-sided flank and abdominal pain with associated nausea and vomiting.  CT imaging at that time showed a small bilateral renal stones, no significant hydronephrosis, and a 6 mm calcification in the area of the left distal ureter close to the UVJ.  She again presented to the emergency room on 11/27/2021 with left-sided flank pain.  Repeat CT imaging demonstrated the 6 mm calculus at the left UVJ with moderate left-sided hydronephrosis and tiny bilateral renal calculi.  She was treated with Toradol which relieved her pain.  She was discharged with Flomax. At the time of her visit on 11/28/2021, she was not having any pain.  She was unaware of passing a stone but had not been straining her urine on a regular basis.  KUB from 12/04/2021 shows a 4 mm calcification in the left pelvis. KUB from this morning shows the 4 mm calcification in the same location in the left pelvis.  She has not had any significant pain.  She continues on tamsulosin.  No dysuria or gross hematuria.  No fevers or chills.  Portions of the above documentation were copied from a prior visit for review purposes only.   Past Medical History:  Past Medical History:  Diagnosis Date   Anemia    Heartburn in pregnancy    zantac   Obesity 01/06/2013   UTI (lower urinary tract infection) 01/06/2013   resolved    Past Surgical History:  Past Surgical History:  Procedure Laterality Date   CESAREAN  SECTION N/A 02/10/2014   Procedure: CESAREAN SECTION;  Surgeon: Megan Morris, DO;  Location: [facility];  Service: Obstetrics;  Laterality: N/A;  Primary edc 02/09/14   CESAREAN SECTION N/A 09/29/2017   Procedure: REPEAT CESAREAN SECTION;  Surgeon: Morris, Megan, DO;  [facility]: [facility];  Service: Obstetrics;  Laterality:  N/A;  Repeat edc 7/5 allergy to Keflex need RNFA   CESAREAN SECTION N/A 05/03/2020   Procedure: CESAREAN SECTION;  Surgeon: Lowe, David, MD;  Location: [facility];  Service: Obstetrics;  Laterality: N/A;   WISDOM TOOTH EXTRACTION      Allergies:  Allergies  Allergen Reactions   Keflex [Cephalexin] Rash    Family History:  Family History  Problem Relation Age of Onset   Diabetes Mother    Depression Mother    Hypertension Father    Hyperlipidemia Father    Asthma Brother    Cancer Paternal Aunt        breast   Cancer Paternal Grandfather        colon, liver    Social History:  Social History   Tobacco Use   Smoking status: Never   Smokeless tobacco: Never  Vaping Use   Vaping Use: Never used  Substance Use Topics   Alcohol use: No   Drug use: No    ROS: Constitutional:  Negative for fever, chills, weight loss CV: Negative for chest pain, previous MI, hypertension Respiratory:  Negative for shortness of breath, wheezing, sleep apnea, frequent cough GI:  Negative for nausea, vomiting, bloody stool, GERD  Physical exam: BP 126/82   Pulse 88   LMP 11/27/2021 (Exact Date) Comment: IUD removed in July2023 GENERAL APPEARANCE:  Well appearing, well developed, well nourished, NAD HEENT:  Atraumatic, normocephalic, oropharynx clear NECK:  Supple without lymphadenopathy or thyromegaly ABDOMEN:  Soft, non-tender, no masses EXTREMITIES:  Moves all extremities well, without clubbing, cyanosis, or edema NEUROLOGIC:  Alert and oriented x 3, normal gait, CN II-XII grossly intact MENTAL STATUS:  appropriate BACK:  Non-tender to palpation, No CVAT SKIN:  Warm, dry, and intact  Results: U/A: dipstick negative   ABDOMEN - 1 VIEW   COMPARISON:  December 04, 2021.  November 27, 2021.   FINDINGS: The bowel gas pattern is normal. Grossly stable calculus seen in the left side of the pelvis consistent with distal left ureteral calculus. No definite nephrolithiasis is noted.    IMPRESSION: Grossly stable calcifications seen in left-sided pelvis consistent with distal left ureteral calculus.     Electronically Signed   By: James  Green Jr M.D.   On: 12/14/2021 11:32 

## 2021-12-14 NOTE — H&P (View-Only) (Signed)
Assessment: 1. Ureteral calculus, left   2. Nephrolithiasis     Plan: I personally reviewed the KUB from 12/14/2021 showing a 4 mm calcification in the left pelvis consistent with a distal ureteral calculus. I discussed management options for the distal ureteral calculus with the patient today including continued spontaneous passage, shockwave lithotripsy, and ureteroscopic stone manipulation. Continue tamsulosin and straining her urine  Pain medication as needed.   Procedure: The patient will be scheduled for left ESL at [facility] on 12/18/21.  Surgical request is placed with the surgery schedulers and will be scheduled at the patient's/family request. Informed consent is given as documented below. Anesthesia:  IV sedation  The patient does not  have sleep apnea, history of MRSA, history of VRE, history of cardiac device requiring special anesthetic needs. Patient is stable and considered clear for surgical in an outpatient ambulatory surgery setting as well as patient hospital setting.  Consent for Operation or Procedure: Provider Certification I hereby certify that the nature, purpose, benefits, usual and most frequent risks of, and alternatives to, the operation or procedure have been explained to the patient (or person authorized to sign for the patient) either by me as responsible physician or by the provider who is to perform the operation or procedure. Time spent such that the patient/family has had an opportunity to ask questions, and that those questions have been answered. The patient or the patient's representative has been advised that selected tasks may be performed by assistants to the primary health care provider(s). I believe that the patient (or person authorized to sign for the patient) understands what has been explained, and has consented to the operation or procedure. No guarantees were implied or made.  Chief Complaint:  Chief Complaint  Patient presents with    ureteral calculus    History of Present Illness:  Megan Barton is a 31 y.o. female who is seen for further evaluation of left ureteral calculus.  She initially presented to the emergency room on 11/13/2021 with right-sided flank and abdominal pain with associated nausea and vomiting.  CT imaging at that time showed a small bilateral renal stones, no significant hydronephrosis, and a 6 mm calcification in the area of the left distal ureter close to the UVJ.  She again presented to the emergency room on 11/27/2021 with left-sided flank pain.  Repeat CT imaging demonstrated the 6 mm calculus at the left UVJ with moderate left-sided hydronephrosis and tiny bilateral renal calculi.  She was treated with Toradol which relieved her pain.  She was discharged with Flomax. At the time of her visit on 11/28/2021, she was not having any pain.  She was unaware of passing a stone but had not been straining her urine on a regular basis.  KUB from 12/04/2021 shows a 4 mm calcification in the left pelvis. KUB from this morning shows the 4 mm calcification in the same location in the left pelvis.  She has not had any significant pain.  She continues on tamsulosin.  No dysuria or gross hematuria.  No fevers or chills.  Portions of the above documentation were copied from a prior visit for review purposes only.   Past Medical History:  Past Medical History:  Diagnosis Date   Anemia    Heartburn in pregnancy    zantac   Obesity 01/06/2013   UTI (lower urinary tract infection) 01/06/2013   resolved    Past Surgical History:  Past Surgical History:  Procedure Laterality Date   CESAREAN  SECTION N/A 02/10/2014   Procedure: CESAREAN SECTION;  Surgeon: Mitchel Honour, DO;  Location: [facility];  Service: Obstetrics;  Laterality: N/A;  Primary edc 02/09/14   CESAREAN SECTION N/A 09/29/2017   Procedure: REPEAT CESAREAN SECTION;  Surgeon: Mitchel Honour, DO;  Location: [facility];  Service: Obstetrics;  Laterality:  N/A;  Repeat edc 7/5 allergy to Keflex need RNFA   CESAREAN SECTION N/A 05/03/2020   Procedure: CESAREAN SECTION;  Surgeon: Candice Camp, MD;  Location: [facility];  Service: Obstetrics;  Laterality: N/A;   WISDOM TOOTH EXTRACTION      Allergies:  Allergies  Allergen Reactions   Keflex [Cephalexin] Rash    Family History:  Family History  Problem Relation Age of Onset   Diabetes Mother    Depression Mother    Hypertension Father    Hyperlipidemia Father    Asthma Brother    Cancer Paternal Aunt        breast   Cancer Paternal Grandfather        colon, liver    Social History:  Social History   Tobacco Use   Smoking status: Never   Smokeless tobacco: Never  Vaping Use   Vaping Use: Never used  Substance Use Topics   Alcohol use: No   Drug use: No    ROS: Constitutional:  Negative for fever, chills, weight loss CV: Negative for chest pain, previous MI, hypertension Respiratory:  Negative for shortness of breath, wheezing, sleep apnea, frequent cough GI:  Negative for nausea, vomiting, bloody stool, GERD  Physical exam: BP 126/82   Pulse 88   LMP 11/27/2021 (Exact Date) Comment: IUD removed in July2023 GENERAL APPEARANCE:  Well appearing, well developed, well nourished, NAD HEENT:  Atraumatic, normocephalic, oropharynx clear NECK:  Supple without lymphadenopathy or thyromegaly ABDOMEN:  Soft, non-tender, no masses EXTREMITIES:  Moves all extremities well, without clubbing, cyanosis, or edema NEUROLOGIC:  Alert and oriented x 3, normal gait, CN II-XII grossly intact MENTAL STATUS:  appropriate BACK:  Non-tender to palpation, No CVAT SKIN:  Warm, dry, and intact  Results: U/A: dipstick negative   ABDOMEN - 1 VIEW   COMPARISON:  December 04, 2021.  November 27, 2021.   FINDINGS: The bowel gas pattern is normal. Grossly stable calculus seen in the left side of the pelvis consistent with distal left ureteral calculus. No definite nephrolithiasis is noted.    IMPRESSION: Grossly stable calcifications seen in left-sided pelvis consistent with distal left ureteral calculus.     Electronically Signed   By: Lupita Raider M.D.   On: 12/14/2021 11:32

## 2021-12-17 ENCOUNTER — Encounter (HOSPITAL_COMMUNITY)
Admission: RE | Admit: 2021-12-17 | Discharge: 2021-12-17 | Disposition: A | Discharge status: Home / Self Care | Payer: Commercial Managed Care - PPO | Source: Ambulatory Visit | Attending: Urology | Admitting: Urology

## 2021-12-17 ENCOUNTER — Encounter (HOSPITAL_COMMUNITY): Payer: Self-pay

## 2021-12-18 ENCOUNTER — Ambulatory Visit (HOSPITAL_COMMUNITY): Payer: Commercial Managed Care - PPO

## 2021-12-18 ENCOUNTER — Encounter: Payer: Self-pay | Admitting: Urology

## 2021-12-18 ENCOUNTER — Other Ambulatory Visit: Payer: Self-pay | Admitting: Urology

## 2021-12-18 ENCOUNTER — Ambulatory Visit (HOSPITAL_COMMUNITY)
Admission: RE | Admit: 2021-12-18 | Discharge: 2021-12-18 | Disposition: A | Discharge status: Home / Self Care | Payer: Commercial Managed Care - PPO | Attending: Urology | Admitting: Urology

## 2021-12-18 ENCOUNTER — Encounter (HOSPITAL_COMMUNITY)
Admission: RE | Disposition: A | Discharge status: Home / Self Care | Payer: Self-pay | Source: Home / Self Care | Attending: Urology

## 2021-12-18 ENCOUNTER — Encounter (HOSPITAL_COMMUNITY): Payer: Self-pay | Admitting: Urology

## 2021-12-18 ENCOUNTER — Ambulatory Visit (INDEPENDENT_AMBULATORY_CARE_PROVIDER_SITE_OTHER): Payer: Commercial Managed Care - PPO | Admitting: Urology

## 2021-12-18 DIAGNOSIS — N201 Calculus of ureter: Secondary | ICD-10-CM

## 2021-12-18 DIAGNOSIS — Z01818 Encounter for other preprocedural examination: Secondary | ICD-10-CM

## 2021-12-18 DIAGNOSIS — N202 Calculus of kidney with calculus of ureter: Secondary | ICD-10-CM | POA: Diagnosis present

## 2021-12-18 HISTORY — PX: EXTRACORPOREAL SHOCK WAVE LITHOTRIPSY: SHX1557

## 2021-12-18 LAB — POCT PREGNANCY, URINE: Preg Test, Ur: NEGATIVE

## 2021-12-18 SURGERY — LITHOTRIPSY, ESWL
Anesthesia: LOCAL | Laterality: Left

## 2021-12-18 MED ORDER — SODIUM CHLORIDE 0.9 % IV SOLN
INTRAVENOUS | Status: DC
  Administered 2021-12-18: 1000 mL via INTRAVENOUS

## 2021-12-18 MED ORDER — ONDANSETRON HCL 4 MG/2ML IJ SOLN
4.0000 mg | Freq: Once | INTRAMUSCULAR | Status: AC
  Administered 2021-12-18: 4 mg via INTRAVENOUS

## 2021-12-18 MED ORDER — TAMSULOSIN HCL 0.4 MG PO CAPS: 0.4000 mg | ORAL_CAPSULE | Freq: Every day | ORAL | 0 refills | Status: DC

## 2021-12-18 MED ORDER — ONDANSETRON HCL 4 MG/2ML IJ SOLN
INTRAMUSCULAR | Status: AC
  Filled 2021-12-18: qty 2

## 2021-12-18 MED ORDER — KETOROLAC TROMETHAMINE 60 MG/2ML IM SOLN
60.0000 mg | Freq: Once | INTRAMUSCULAR | Status: AC
  Administered 2021-12-18: 60 mg via INTRAMUSCULAR

## 2021-12-18 MED ORDER — DIAZEPAM 5 MG PO TABS
10.0000 mg | ORAL_TABLET | Freq: Once | ORAL | Status: AC
  Administered 2021-12-18: 10 mg via ORAL
  Filled 2021-12-18: qty 2

## 2021-12-18 MED ORDER — DIPHENHYDRAMINE HCL 25 MG PO CAPS
25.0000 mg | ORAL_CAPSULE | ORAL | Status: AC
  Administered 2021-12-18: 25 mg via ORAL
  Filled 2021-12-18: qty 1

## 2021-12-18 NOTE — Progress Notes (Signed)
   Assessment: 1. Ureteral calculus     Plan: Patient was given Toradol 60 mg IM in the office today. Her symptoms are consistent with renal colic likely due to passage stone fragments following lithotripsy. Recommend continuing Flomax. Strain urine. Pain medication as needed. Keep follow-up as scheduled. Patient advised to call with any additional problems.  Chief Complaint: Chief Complaint  Patient presents with   Post-op Problem    HPI: Megan Barton is a 31 y.o. female who presents for acute evaluation of left flank pain following left ESL this AM.  She underwent treatment of a distal left ureteral stone this morning.  The stone appeared to fragment nicely during the treatment.  She has had some nausea following the procedure.  She is also experiencing left-sided flank pain.  She feels like she has to void but is unable to empty her her bladder.  She reports passing a small blood clot as well.   Portions of the above documentation were copied from a prior visit for review purposes only.  Allergies: Allergies  Allergen Reactions   Keflex [Cephalexin] Rash    PMH: Past Medical History:  Diagnosis Date   Anemia    Heartburn in pregnancy    zantac   Obesity 01/06/2013   UTI (lower urinary tract infection) 01/06/2013   resolved    PSH: Past Surgical History:  Procedure Laterality Date   CESAREAN SECTION N/A 02/10/2014   Procedure: CESAREAN SECTION;  Surgeon: Mitchel Honour, DO;  Location: [facility];  Service: Obstetrics;  Laterality: N/A;  Primary edc 02/09/14   CESAREAN SECTION N/A 09/29/2017   Procedure: REPEAT CESAREAN SECTION;  Surgeon: Mitchel Honour, DO;  [facility]: [facility];  Service: Obstetrics;  Laterality: N/A;  Repeat edc 7/5 allergy to Keflex need RNFA   CESAREAN SECTION N/A 05/03/2020   Procedure: CESAREAN SECTION;  Surgeon: Candice Camp, MD;  Location: [facility];  Service: Obstetrics;  Laterality: N/A;   WISDOM TOOTH EXTRACTION      SH: Social  History   Tobacco Use   Smoking status: Never   Smokeless tobacco: Never  Vaping Use   Vaping Use: Never used  Substance Use Topics   Alcohol use: No   Drug use: No    ROS: Constitutional:  Negative for fever, chills, weight loss CV: Negative for chest pain, previous MI, hypertension Respiratory:  Negative for shortness of breath, wheezing, sleep apnea, frequent cough GI:  Negative for vomiting, bloody stool, GERD  PE: LMP 11/27/2021 (Exact Date) Comment: IUD removed in July2023 GENERAL APPEARANCE:  Well appearing, well developed, well nourished, NAD HEENT:  Atraumatic, normocephalic, oropharynx clear NECK:  Supple without lymphadenopathy or thyromegaly ABDOMEN:  Soft, non-tender, no masses EXTREMITIES:  Moves all extremities well, without clubbing, cyanosis, or edema NEUROLOGIC:  Alert and oriented x 3, normal gait, CN II-XII grossly intact MENTAL STATUS:  appropriate BACK:  Non-tender to palpation, No CVAT SKIN:  Warm, dry, and intact   Results: None  PVR = 20 ml

## 2021-12-18 NOTE — Interval H&P Note (Signed)
History and Physical Interval Note:  12/18/2021 8:46 AM  Megan Barton  has presented today for surgery, with the diagnosis of left ureteral calculus.  The various methods of treatment have been discussed with the patient and family. After consideration of risks, benefits and other options for treatment, the patient has consented to  Procedure(s) with comments: EXTRACORPOREAL SHOCK WAVE LITHOTRIPSY (ESWL) (Left) - pt knows to arrive at 8:00 as a surgical intervention.  The patient's history has been reviewed, patient examined, no change in status, stable for surgery.  I have reviewed the patient's chart and labs.  Questions were answered to the patient's satisfaction.     Di Kindle

## 2021-12-19 ENCOUNTER — Encounter: Payer: Self-pay | Admitting: Urology

## 2021-12-19 LAB — CALCULI, WITH PHOTOGRAPH (CLINICAL LAB)
Calcium Oxalate Dihydrate: 30 %
Calcium Oxalate Monohydrate: 70 %
Weight Calculi: 4 mg

## 2021-12-20 ENCOUNTER — Encounter (HOSPITAL_COMMUNITY): Payer: Self-pay | Admitting: Urology

## 2021-12-25 ENCOUNTER — Ambulatory Visit: Payer: Commercial Managed Care - PPO | Admitting: Gastroenterology

## 2021-12-25 NOTE — Progress Notes (Unsigned)
Referring Provider: Dr. Henreitta LeberBridges  Primary Care Physician:  Avis EpleyJackson, Samantha J, PA-C Primary Gastroenterologist:  Dr. Jena Gaussourk  Chief Complaint  Patient presents with   Abdominal Pain    Has gall stones     HPI:   Megan Barton is a 31 y.o. female presenting today at the request of Dr. Henreitta LeberBridges for IBS.  Patient saw Dr. Henreitta LeberBridges 12/11/2021 for cholelithiasis, RUQ abdominal pain that was not necessarily associated with food and denied nausea or vomiting. She was tender in the right upper quadrant on exam.  Also with known history of kidney stones and was planning for treatment with lithotripsy the following week. She also reported history of alternating constipation and diarrhea. Dr. Henreitta LeberBridges offered cholecystectomy, but patient stated she would let Dr. Henreitta LeberBridges know about the timing of surgery after thinking about things and getting her kidney stones taken care of. Dr. Henreitta LeberBridges also recommended seeing GI.   She underwent lithotripsy on 12/18/2021.  Today:  No abdominal pain since her first visit to the ED on 11/13/21.   Has alternating constipation to diarrhea. Usually with 1 BM per day. Can have up to 5 Bms per days. Can skip up to 1-2 days between bowel movements. This is less frequent than the looser stools.  Not sure what triggers her diarrhea.  Thinks spicy foods, greasy foods and possibly bread, but sometimes will occur for no reason. Associated lower abdominal cramping that improves after a bowel movement. Feels like she has to strain even with diarrhea at times.  Doesn't take anything for diarrhea or constipation.  Heartburn a couple days a week. Usually at night when she lays down. Has to sit up. Takes tums or Zantac as needed.  Was worse in pregnancy. Intermittent sensation of chicken or bread getting hung in her mid chest, "like it is stacking up". No regurgitation. This has been present for years. Occurs at least once a week.   No abdominal pain since August. In August, she woke up in the  middle of the night with severe pain in her RUQ and went to the ED. Had a peanut butter sandwich before bed. No history prior to this of abdominal pain.   Has hemorrhoids. Has had some rectal bleeding intermittently for several years. Blood has been in the toilet and on tissue. Last occurred a few months ago. Usually this occurs when she is having diarrhea and her bottom gets raw from frequent wiping and hemorrhoids prolapse. Uses witch hazel or prep H as needed.   Paternal grandfather passed from colon cancer in his 970s.   Wants to hold off on procedures for now.    Past Medical History:  Diagnosis Date   Anemia    Heartburn in pregnancy    zantac   Obesity 01/06/2013   UTI (lower urinary tract infection) 01/06/2013   resolved    Past Surgical History:  Procedure Laterality Date   CESAREAN SECTION N/A 02/10/2014   Procedure: CESAREAN SECTION;  Surgeon: Mitchel HonourMegan Morris, DO;  Location: [facility];  Service: Obstetrics;  Laterality: N/A;  Primary edc 02/09/14   CESAREAN SECTION N/A 09/29/2017   Procedure: REPEAT CESAREAN SECTION;  Surgeon: Mitchel HonourMorris, Megan, DO;  Location: [facility];  Service: Obstetrics;  Laterality: N/A;  Repeat edc 7/5 allergy to Keflex need RNFA   CESAREAN SECTION N/A 05/03/2020   Procedure: CESAREAN SECTION;  Surgeon: Candice CampLowe, David, MD;  Location: [facility];  Service: Obstetrics;  Laterality: N/A;   EXTRACORPOREAL SHOCK WAVE LITHOTRIPSY Left 12/18/2021  Procedure: EXTRACORPOREAL SHOCK WAVE LITHOTRIPSY (ESWL);  Surgeon: Milderd Meager., MD;  Location: [facility];  Service: Urology;  Laterality: Left;  pt knows to arrive at 8:00   WISDOM TOOTH EXTRACTION      Current Outpatient Medications  Medication Sig Dispense Refill   dicyclomine (BENTYL) 10 MG capsule Take 1 capsule (10 mg total) by mouth 4 (four) times daily -  before meals and at bedtime. 120 capsule 1   pantoprazole (PROTONIX) 40 MG tablet Take 1 tablet (40 mg total) by mouth daily before breakfast. 30  tablet 3   acetaminophen (TYLENOL) 500 MG tablet Take 500 mg by mouth every 6 (six) hours as needed. (Patient not taking: Reported on 12/26/2021)     No current facility-administered medications for this visit.    Allergies as of 12/26/2021 - Review Complete 12/26/2021  Allergen Reaction Noted   Keflex [cephalexin] Rash 11/06/2012    Family History  Problem Relation Age of Onset   Diabetes Mother    Depression Mother    Hypertension Father    Hyperlipidemia Father    Asthma Brother    Cancer Paternal Grandfather        colon (16s), liver   Cancer Paternal Aunt        breast   Inflammatory bowel disease Neg Hx     Social History   Socioeconomic History   Marital status: Married    Spouse name: Not on file   Number of children: Not on file   Years of education: Not on file   Highest education level: Not on file  Occupational History   Not on file  Tobacco Use   Smoking status: Never   Smokeless tobacco: Never  Vaping Use   Vaping Use: Never used  Substance and Sexual Activity   Alcohol use: No   Drug use: No   Sexual activity: Not Currently    Birth control/protection: None    Comment: pregnant  Other Topics Concern   Not on file  Social History Narrative   Not on file   Social Determinants of Health   Financial Resource Strain: Not on file  Food Insecurity: Not on file  Transportation Needs: Not on file  Physical Activity: Not on file  Stress: Not on file  Social Connections: Not on file  Intimate Partner Violence: Not on file    Review of Systems: Gen: Denies any fever, chills, cold or flulike symptoms, presyncope, syncope. CV: Denies chest pain, heart palpitations.  Resp: Denies shortness of breath, cough.  GI: See HPI GU : Denies urinary burning, urinary frequency, urinary hesitancy MS: Denies joint pain. Derm: Denies rash. Psych: Denies depression, anxiety. Heme: See HPI  Physical Exam: BP 123/82 (BP Location: Right Arm, Patient Position:  Sitting, Cuff Size: Normal)   Pulse 83   Temp 97.8 F (36.6 C) (Temporal)   Ht 5\' 10"  (1.778 m)   Wt 275 lb 12.8 oz (125.1 kg)   LMP 11/27/2021 (Exact Date) Comment: IUD removed in July2023  SpO2 98%   BMI 39.57 kg/m  General:   Alert and oriented. Pleasant and cooperative. Well-nourished and well-developed.  Head:  Normocephalic and atraumatic. Eyes:  Without icterus, sclera clear and conjunctiva pink.  Ears:  Normal auditory acuity. Lungs:  Clear to auscultation bilaterally. No wheezes, rales, or rhonchi. No distress.  Heart:  S1, S2 present without murmurs appreciated.  Abdomen:  +BS, soft, non-tender and non-distended. No HSM noted. No guarding or rebound. No masses appreciated.  Rectal:  Patient  declined.   Msk:  Symmetrical without gross deformities. Normal posture. Extremities:  Without edema. Neurologic:  Alert and  oriented x4;  grossly normal neurologically. Skin:  Intact without significant lesions or rashes. Psych: Normal mood and affect.    Assessment:  31 year old female presenting today at the request of Dr. Constance Haw for possible IBS in the setting of alternating constipation and diarrhea.  Also discussed rectal bleeding, heartburn, and dysphagia.   Alternating constipation and diarrhea:  Likely IBS, diarrhea predominant with bowel frequency ranging from 1-5 bowel movements per day.  Infrequently, she skips 1 or 2 days between bowel movements.  Associated lower abdominal pain that improves with bowel movements.  She is reporting intermittent rectal bleeding in the setting of hemorrhoids when bowel movements are more frequent which has been present for several years now.  Recent hemoglobin within normal limits in August.  CT A/P with contrast in August without bowel abnormalities. No known family history of IBD.  Paternal grandfather with history of colon cancer in his 70s.  Again I suspect IBS, but we will check thyroid function and screen for celiac disease.  Offered  colonoscopy to rule out IBD and to further evaluate rectal bleeding, but patient prefers to hold off on this for now. I will start her on dicyclomine for symptom control.   Rectal bleeding: Intermittent.  Occurs in the setting of increased bowel frequency and reported hemorrhoids that prolapse with associated rectal discomfort.  Suspect bleeding is secondary to hemorrhoids, but unable to rule out other causes such as IBD, colon polyps, or malignancy.  Family history significant for paternal grandfather with colon cancer in his 53s.  Offered colonoscopy for further evaluation, but patient prefers to hold off on this for now and will let me know if she would like to proceed.   GERD/dysphagia: Chronic intermittent heartburn/reflux symptoms, primarily at night having to sit up to go to sleep at times using Tums or Zantac as needed.  She is also reporting intermittent solid food dysphagia to chicken and bread occurring at least weekly.  She may have esophagitis, esophageal web, ring, stricture in the setting of chronic GERD.  Recommended starting PPI daily and reinforced GERD diet/lifestyle.  Also recommended EGD to further evaluate dysphagia, but patient prefers to hold off on this for now and will let me know if she changes her mind.  Cholelithiasis: Single episode of acute onset RUQ abdominal pain in early August prompting ER evaluation.  She was found to have cholelithiasis without acute cholecystitis.  She has not had any recurrent abdominal pain since that time.  She did see Dr. Constance Haw with general surgery who offered cholecystectomy, but patient is still thinking about whether or not she will pursue this.  Encouraged that she follow a low-fat diet.  Plan:  TSH, TTG IgA, IgA Patient will let me know if she would like to pursue EGD and colonoscopy to evaluate dysphagia, rectal bleeding, diarrhea. Start dicyclomine 10 mg up to 3 times daily before meals and at bedtime.  Hold in the setting of  constipation. Start Benefiber 2 teaspoons daily x2 weeks, then increase to twice daily. Avoid straining. Limit toilet time to 2-3 minutes. Use Preparation H or witch hazel as needed. Start pantoprazole 40 mg daily 30 minutes before breakfast. Counseled on GERD diet/lifestyle.  Separate written instructions provided.  See AVS. Swallowing precautions discussed including ER precautions.  Separate written instructions provided.  See AVS. Follow-up with Dr. Constance Haw if she would like to pursue cholecystectomy. Encourage low-fat  diet. Follow-up in 3-4 months or sooner if needed.   Ermalinda Memos, PA-C [facility] 12/26/2021

## 2021-12-26 ENCOUNTER — Ambulatory Visit (INDEPENDENT_AMBULATORY_CARE_PROVIDER_SITE_OTHER): Payer: Commercial Managed Care - PPO | Admitting: Gastroenterology

## 2021-12-26 ENCOUNTER — Encounter: Payer: Self-pay | Admitting: Gastroenterology

## 2021-12-26 VITALS — BP 123/82 | HR 83 | Temp 97.8°F | Ht 70.0 in | Wt 275.8 lb

## 2021-12-26 DIAGNOSIS — K219 Gastro-esophageal reflux disease without esophagitis: Secondary | ICD-10-CM | POA: Diagnosis not present

## 2021-12-26 DIAGNOSIS — R198 Other specified symptoms and signs involving the digestive system and abdomen: Secondary | ICD-10-CM | POA: Insufficient documentation

## 2021-12-26 DIAGNOSIS — K625 Hemorrhage of anus and rectum: Secondary | ICD-10-CM | POA: Diagnosis not present

## 2021-12-26 DIAGNOSIS — R1310 Dysphagia, unspecified: Secondary | ICD-10-CM

## 2021-12-26 DIAGNOSIS — K8020 Calculus of gallbladder without cholecystitis without obstruction: Secondary | ICD-10-CM

## 2021-12-26 MED ORDER — PANTOPRAZOLE SODIUM 40 MG PO TBEC: 40.0000 mg | DELAYED_RELEASE_TABLET | Freq: Every day | ORAL | 3 refills | Status: AC

## 2021-12-26 MED ORDER — DICYCLOMINE HCL 10 MG PO CAPS: 10.0000 mg | ORAL_CAPSULE | Freq: Three times a day (TID) | ORAL | 1 refills | Status: DC

## 2021-12-26 NOTE — Patient Instructions (Addendum)
Please have blood work completed at [facility]  We will hold off on scheduling an upper endoscopy or colonoscopy for now as you have requested.  Please let me know if you change your mind.  I suspect you likely have IBS, diarrhea predominant.  To help with this, use dicyclomine 10 mg up to 3 times daily before meals and at bedtime to control diarrhea and abdominal cramping.  Hold in the setting of constipation. I recommend that you start with taking dicyclomine once daily and increase as needed.  I also recommend starting Benefiber 2 teaspoons daily x2 weeks, then increasing to twice daily to help with bowel regularity.  For hemorrhoids: Avoid straining. Limit toilet time to 2-3 minutes. Use Preparation H or witch hazel as needed.  For reflux: Start pantoprazole 40 mg daily 30 minutes before breakfast. Follow a GERD diet/lifestyle:  Avoid fried, fatty, greasy, spicy, citrus foods. Avoid caffeine and carbonated beverages. Avoid chocolate. Try eating 4-6 small meals a day rather than 3 large meals. Do not eat within 3 hours of laying down. Prop head of bed up on wood or bricks to create a 6 inch incline.  For your swallowing problems: Eat slowly, take small bites, chew thoroughly, drink plenty of liquids throughout meals.  Avoid trough textures All meats should be chopped finely.  If something gets hung in your esophagus and will not come up or go down, proceed to the emergency room.    For gallstones: Follow a low-fat diet. Follow-up with Dr. Constance Haw if you would like to have your gallbladder removed.   We will follow-up with you in 3-4 months.  Do not hesitate to call sooner if you have questions or concerns.  It was nice meeting you today!  Aliene Altes, PA-C [facility]

## 2022-01-04 ENCOUNTER — Encounter: Payer: Self-pay | Admitting: Urology

## 2022-01-04 ENCOUNTER — Ambulatory Visit (HOSPITAL_COMMUNITY)
Admission: RE | Admit: 2022-01-04 | Discharge: 2022-01-04 | Disposition: A | Discharge status: Home / Self Care | Payer: Commercial Managed Care - PPO | Source: Ambulatory Visit | Attending: Urology | Admitting: Urology

## 2022-01-04 ENCOUNTER — Ambulatory Visit (INDEPENDENT_AMBULATORY_CARE_PROVIDER_SITE_OTHER): Payer: Commercial Managed Care - PPO | Admitting: Urology

## 2022-01-04 VITALS — BP 123/87 | HR 67

## 2022-01-04 DIAGNOSIS — N201 Calculus of ureter: Secondary | ICD-10-CM

## 2022-01-04 DIAGNOSIS — Z87442 Personal history of urinary calculi: Secondary | ICD-10-CM | POA: Diagnosis not present

## 2022-01-04 DIAGNOSIS — N200 Calculus of kidney: Secondary | ICD-10-CM

## 2022-01-04 LAB — POCT URINALYSIS DIPSTICK
Bilirubin, UA: NEGATIVE
Blood, UA: NEGATIVE
Glucose, UA: NEGATIVE
Ketones, UA: NEGATIVE
Leukocytes, UA: NEGATIVE
Nitrite, UA: NEGATIVE
Protein, UA: NEGATIVE
Spec Grav, UA: 1.02 (ref 1.010–1.025)
Urobilinogen, UA: 0.2 E.U./dL
pH, UA: 6.5 (ref 5.0–8.0)

## 2022-01-04 NOTE — Progress Notes (Signed)
Assessment: 1. Ureteral calculus   2. Nephrolithiasis     Plan: I personally reviewed the KUB from today.  No obvious calcifications are seen in the area of the left distal ureter following shockwave lithotripsy. Stone prevention discussed. Renal ultrasound in 6 weeks -we will call with results. Ret urn to office in 1 year with KUB.  Chief Complaint: Chief Complaint  Patient presents with   ureteral calculus    HPI: Megan Barton is a 31 y.o. female who presents for continued evaluation of a left ureteral calculus.  She initially presented to the emergency room on 11/13/2021 with right-sided flank and abdominal pain with associated nausea and vomiting.  CT imaging at that time showed a small bilateral renal stones, no significant hydronephrosis, and a 6 mm calcification in the area of the left distal ureter close to the UVJ.  She again presented to the emergency room on 11/27/2021 with left-sided flank pain.  Repeat CT imaging demonstrated the 6 mm calculus at the left UVJ with moderate left-sided hydronephrosis and tiny bilateral renal calculi.  She was treated with Toradol which relieved her pain.  She was discharged with Flomax. At the time of her visit on 11/28/2021, she was not having any pain.  She was unaware of passing a stone but had not been straining her urine on a regular basis. KUB from 12/04/2021 shows a 4 mm calcification in the left pelvis. KUB from this morning shows the 4 mm calcification in the same location in the left pelvis.  She underwent treatment of a distal left ureteral stone with ESL on 12/18/21.  The stone appeared to fragment nicely during the treatment.  Stone analysis:  calcium oxalate  She returns today for follow-up.  She has passed multiple stone fragments.  She is not having any flank pain today.  No dysuria or gross hematuria.  Portions of the above documentation were copied from a prior visit for review purposes only.  Allergies: Allergies   Allergen Reactions   Keflex [Cephalexin] Rash    PMH: Past Medical History:  Diagnosis Date   Anemia    Heartburn in pregnancy    zantac   Obesity 01/06/2013   UTI (lower urinary tract infection) 01/06/2013   resolved    PSH: Past Surgical History:  Procedure Laterality Date   CESAREAN SECTION N/A 02/10/2014   Procedure: CESAREAN SECTION;  Surgeon: Linda Hedges, DO;  Location: [facility];  Service: Obstetrics;  Laterality: N/A;  Primary edc 02/09/14   CESAREAN SECTION N/A 09/29/2017   Procedure: REPEAT CESAREAN SECTION;  Surgeon: Linda Hedges, DO;  Location: [facility];  Service: Obstetrics;  Laterality: N/A;  Repeat edc 7/5 allergy to Keflex need RNFA   CESAREAN SECTION N/A 05/03/2020   Procedure: CESAREAN SECTION;  Surgeon: Louretta Shorten, MD;  Location: [facility];  Service: Obstetrics;  Laterality: N/A;   EXTRACORPOREAL SHOCK WAVE LITHOTRIPSY Left 12/18/2021   Procedure: EXTRACORPOREAL SHOCK WAVE LITHOTRIPSY (ESWL);  Surgeon: Primus Bravo., MD;  Location: [facility];  Service: Urology;  Laterality: Left;  pt knows to arrive at 8:00   [place]: Social History   Tobacco Use   Smoking status: Never   Smokeless tobacco: Never  Vaping Use   Vaping Use: Never used  Substance Use Topics   Alcohol use: No   Drug use: No    ROS: Constitutional:  Negative for fever, chills, weight loss CV: Negative for chest pain, previous MI, hypertension  Respiratory:  Negative for shortness of breath, wheezing, sleep apnea, frequent cough GI:  Negative for nausea, vomiting, bloody stool, GERD  PE: BP 123/87   Pulse 67  GENERAL APPEARANCE:  Well appearing, well developed, well nourished, NAD HEENT:  Atraumatic, normocephalic, oropharynx clear NECK:  Supple without lymphadenopathy or thyromegaly ABDOMEN:  Soft, non-tender, no masses EXTREMITIES:  Moves all extremities well, without clubbing, cyanosis, or edema NEUROLOGIC:  Alert and oriented x 3, normal  gait, CN II-XII grossly intact MENTAL STATUS:  appropriate BACK:  Non-tender to palpation, No CVAT SKIN:  Warm, dry, and intact   Results: Results for orders placed or performed in visit on 01/04/22 (from the past 24 hour(s))  POCT urinalysis dipstick     Status: None   Collection Time: 01/04/22 12:21 PM  Result Value Ref Range   Color, UA yellow    Clarity, UA clear    Glucose, UA Negative Negative   Bilirubin, UA negative    Ketones, UA negative    Spec Grav, UA 1.020 1.010 - 1.025   Blood, UA negative    pH, UA 6.5 5.0 - 8.0   Protein, UA Negative Negative   Urobilinogen, UA 0.2 0.2 or 1.0 E.U./dL   Nitrite, UA negative    Leukocytes, UA Negative Negative   Appearance     Odor

## 2022-01-05 LAB — TSH: TSH: 1.48 mIU/L

## 2022-01-05 LAB — TISSUE TRANSGLUTAMINASE, IGA: (tTG) Ab, IgA: <1 U/mL

## 2022-01-05 LAB — IGA: Immunoglobulin A: 157 mg/dL (ref 47–310)

## 2022-01-10 ENCOUNTER — Encounter: Payer: Self-pay | Admitting: *Deleted

## 2022-02-01 ENCOUNTER — Ambulatory Visit (HOSPITAL_COMMUNITY)
Admission: RE | Admit: 2022-02-01 | Discharge: 2022-02-01 | Disposition: A | Discharge status: Home / Self Care | Payer: Commercial Managed Care - PPO | Source: Ambulatory Visit | Attending: Urology | Admitting: Urology

## 2022-02-01 DIAGNOSIS — N201 Calculus of ureter: Secondary | ICD-10-CM | POA: Diagnosis present

## 2022-02-13 ENCOUNTER — Encounter: Payer: Self-pay | Admitting: Gastroenterology

## 2022-07-30 ENCOUNTER — Ambulatory Visit (INDEPENDENT_AMBULATORY_CARE_PROVIDER_SITE_OTHER): Payer: Commercial Managed Care - PPO | Admitting: General Surgery

## 2022-07-30 ENCOUNTER — Encounter: Payer: Self-pay | Admitting: General Surgery

## 2022-07-30 VITALS — BP 156/104 | HR 78 | Temp 98.3°F | Resp 16 | Ht 70.0 in | Wt 283.0 lb

## 2022-07-30 DIAGNOSIS — K8020 Calculus of gallbladder without cholecystitis without obstruction: Secondary | ICD-10-CM | POA: Diagnosis not present

## 2022-07-30 NOTE — Progress Notes (Unsigned)
[facility] History and Physical  Reason for Referral:*** Referring Physician: ***  Chief Complaint   Follow-up     Megan Barton is a 31 y.o. female.  HPI: ***.  The *** started *** and has had a duration of ***.  It is associated with ***.  The *** is improved with ***, and is made worse with ***.    Quality*** Context***  Past Medical History:  Diagnosis Date   Anemia    Heartburn in pregnancy    zantac   Obesity 01/06/2013   UTI (lower urinary tract infection) 01/06/2013   resolved    Past Surgical History:  Procedure Laterality Date   CESAREAN SECTION N/A 02/10/2014   Procedure: CESAREAN SECTION;  Surgeon: Megan Honour, DO;  Location: [facility];  Service: Obstetrics;  Laterality: N/A;  Primary edc 02/09/14   CESAREAN SECTION N/A 09/29/2017   Procedure: REPEAT CESAREAN SECTION;  Surgeon: Megan Honour, DO;  Location: [facility];  Service: Obstetrics;  Laterality: N/A;  Repeat edc 7/5 allergy to Keflex need RNFA   CESAREAN SECTION N/A 05/03/2020   Procedure: CESAREAN SECTION;  Surgeon: Megan Camp, MD;  Location: [facility];  Service: Obstetrics;  Laterality: N/A;   EXTRACORPOREAL SHOCK WAVE LITHOTRIPSY Left 12/18/2021   Procedure: EXTRACORPOREAL SHOCK WAVE LITHOTRIPSY (ESWL);  Surgeon: Megan Barton., MD;  Location: [facility];  Service: Urology;  Laterality: Left;  pt knows to arrive at 8:00   WISDOM TOOTH EXTRACTION      Family History  Problem Relation Age of Onset   Diabetes Mother    Depression Mother    Hypertension Father    Hyperlipidemia Father    Asthma Brother    Cancer Paternal Grandfather        colon (56s), liver   Cancer Paternal Aunt        breast   Inflammatory bowel disease Neg Hx     Social History   Tobacco Use   Smoking status: Never   Smokeless tobacco: Never  Vaping Use   Vaping Use: Never used  Substance Use Topics   Alcohol use: No   Drug use: No    Medications: {medication  reviewed/display:3041432} Allergies as of 07/30/2022       Reactions   Keflex [cephalexin] Rash        Medication List        Accurate as of July 30, 2022 10:26 AM. If you have any questions, ask your nurse or doctor.          dicyclomine 10 MG capsule Commonly known as: BENTYL Take 1 capsule (10 mg total) by mouth 4 (four) times daily -  before meals and at bedtime.   pantoprazole 40 MG tablet Commonly known as: PROTONIX Take 1 tablet (40 mg total) by mouth daily before breakfast.         ROS:  {Review of Systems:30496}  Blood pressure (!) 156/104, pulse 78, temperature 98.3 F (36.8 C), temperature source Oral, resp. rate 16, height  (1.778 m), weight 283 lb (128.4 kg), SpO2 98 %. Physical Exam  Results: No results found for this or any previous visit (from the past 48 hour(s)).  No results found.   Assessment & Plan:  Megan Barton is a 32 y.o. female with *** -*** -*** -Follow up ***  All questions were answered to the satisfaction of the patient and family***.  The risk and benefits of *** were discussed including but not limited to ***.  After  careful consideration, Megan Barton has decided to ***.    Megan Barton 07/30/2022, 10:26 AM

## 2022-07-30 NOTE — Patient Instructions (Signed)
Options for surgery May 6, 8, 9, 10, 13.   If you want future dates call the office.

## 2023-01-08 ENCOUNTER — Ambulatory Visit: Payer: Commercial Managed Care - PPO | Admitting: Urology

## 2023-02-04 ENCOUNTER — Ambulatory Visit: Payer: Commercial Managed Care - PPO | Admitting: Urology

## 2023-02-20 ENCOUNTER — Other Ambulatory Visit: Payer: Self-pay | Admitting: Urology

## 2023-02-20 ENCOUNTER — Encounter: Payer: Self-pay | Admitting: Urology

## 2023-02-20 ENCOUNTER — Ambulatory Visit: Payer: Commercial Managed Care - PPO | Admitting: Urology

## 2023-02-20 ENCOUNTER — Ambulatory Visit (HOSPITAL_BASED_OUTPATIENT_CLINIC_OR_DEPARTMENT_OTHER)
Admission: RE | Admit: 2023-02-20 | Discharge: 2023-02-20 | Disposition: A | Discharge status: Home / Self Care | Payer: Commercial Managed Care - PPO | Source: Ambulatory Visit | Attending: Urology | Admitting: Urology

## 2023-02-20 VITALS — BP 135/83 | HR 82 | Ht 70.0 in | Wt 259.0 lb

## 2023-02-20 DIAGNOSIS — N200 Calculus of kidney: Secondary | ICD-10-CM

## 2023-02-20 DIAGNOSIS — Z87442 Personal history of urinary calculi: Secondary | ICD-10-CM | POA: Diagnosis not present

## 2023-02-20 LAB — MICROSCOPIC EXAMINATION

## 2023-02-20 LAB — URINALYSIS, ROUTINE W REFLEX MICROSCOPIC
Bilirubin, UA: NEGATIVE
Glucose, UA: NEGATIVE
Ketones, UA: NEGATIVE
Leukocytes,UA: NEGATIVE
Nitrite, UA: NEGATIVE
Protein,UA: NEGATIVE
Specific Gravity, UA: 1.025 (ref 1.005–1.030)
Urobilinogen, Ur: 0.2 mg/dL (ref 0.2–1.0)
pH, UA: 7.5 (ref 5.0–7.5)

## 2023-02-20 NOTE — Progress Notes (Signed)
Assessment: 1. Nephrolithiasis     Plan: Continue stone prevention  KUB today Return to office prn  Chief Complaint: Chief Complaint  Patient presents with   Nephrolithiasis    HPI: Megan Barton is a 32 y.o. female who presents for continued evaluation of nephrolithiasis.   She initially presented to the emergency room on 11/13/2021 with right-sided flank and abdominal pain with associated nausea and vomiting.  CT imaging at that time showed a small bilateral renal stones, no significant hydronephrosis, and a 6 mm calcification in the area of the left distal ureter close to the UVJ.  She again presented to the emergency room on 11/27/2021 with left-sided flank pain.  Repeat CT imaging demonstrated the 6 mm calculus at the left UVJ with moderate left-sided hydronephrosis and tiny bilateral renal calculi.  She was treated with Toradol which relieved her pain.  She was discharged with Flomax. At the time of her visit on 11/28/2021, she was not having any pain.  She was unaware of passing a stone but had not been straining her urine on a regular basis. KUB from 12/04/2021 shows a 4 mm calcification in the left pelvis. KUB from 12/14/2021 showed the 4 mm calcification in the same location in the left pelvis.  She underwent treatment of a distal left ureteral stone with ESL on 12/18/21.  The stone appeared to fragment nicely during the treatment.  Stone analysis:  calcium oxalate KUB from 01/07/2022 showed no obvious renal or ureteral calculi. Renal ultrasound from 02/01/2022 showed normal kidneys bilaterally without mass or hydronephrosis.  She returns today for follow-up.  She has done very well over the past year.  No recent stone symptoms.  No flank pain.  No dysuria or gross hematuria.  Portions of the above documentation were copied from a prior visit for review purposes only.  Allergies: Allergies  Allergen Reactions   Keflex [Cephalexin] Rash    PMH: Past Medical History:   Diagnosis Date   Anemia    Heartburn in pregnancy    zantac   Obesity 01/06/2013   UTI (lower urinary tract infection) 01/06/2013   resolved    PSH: Past Surgical History:  Procedure Laterality Date   CESAREAN SECTION N/A 02/10/2014   Procedure: CESAREAN SECTION;  Surgeon: Mitchel Honour, DO;  Location: [facility];  Service: Obstetrics;  Laterality: N/A;  Primary edc 02/09/14   CESAREAN SECTION N/A 09/29/2017   Procedure: REPEAT CESAREAN SECTION;  Surgeon: Mitchel Honour, DO;  [facility];  Service: Obstetrics;  Laterality: N/A;  Repeat edc 7/5 allergy to Keflex need RNFA   CESAREAN SECTION N/A 05/03/2020   Procedure: CESAREAN SECTION;  Surgeon: Candice Camp, MD;  [facility];  Service: Obstetrics;  Laterality: N/A;   EXTRACORPOREAL SHOCK WAVE LITHOTRIPSY Left 12/18/2021   Procedure: EXTRACORPOREAL SHOCK WAVE LITHOTRIPSY (ESWL);  Surgeon: Milderd Meager., MD;  Location: [facility];  Service: Urology;  Laterality: Left;  pt knows to arrive at 8:00   WISDOM TOOTH EXTRACTION      SH: Social History   Tobacco Use   Smoking status: Never   Smokeless tobacco: Never  Vaping Use   Vaping status: Never Used  Substance Use Topics   Alcohol use: No   Drug use: No    ROS: Constitutional:  Negative for fever, chills, weight loss CV: Negative for chest pain, previous MI, hypertension Respiratory:  Negative for shortness of breath, wheezing, sleep apnea, frequent cough GI:  Negative for nausea, vomiting, bloody stool,  GERD  PE: BP 135/83   Pulse 82   Ht 5\' 10"  (1.778 m)   Wt 259 lb (117.5 kg)   BMI 37.16 kg/m  GENERAL APPEARANCE:  Well appearing, well developed, well nourished, NAD HEENT:  Atraumatic, normocephalic, oropharynx clear NECK:  Supple without lymphadenopathy or thyromegaly ABDOMEN:  Soft, non-tender, no masses EXTREMITIES:  Moves all extremities well, without clubbing, cyanosis, or edema NEUROLOGIC:  Alert and oriented x 3, normal gait, CN II-XII  grossly intact MENTAL STATUS:  appropriate BACK:  Non-tender to palpation, No CVAT SKIN:  Warm, dry, and intact   Results: U/A: 0 WBC, 3-10 RBC (on menses)

## 2023-02-26 ENCOUNTER — Encounter: Payer: Self-pay | Admitting: Urology

## 2023-10-02 ENCOUNTER — Ambulatory Visit: Payer: Self-pay

## 2023-11-17 ENCOUNTER — Ambulatory Visit: Admission: EM | Admit: 2023-11-17 | Discharge: 2023-11-17 | Disposition: A | Discharge status: Home / Self Care

## 2023-11-17 ENCOUNTER — Ambulatory Visit (HOSPITAL_COMMUNITY)
Admission: RE | Admit: 2023-11-17 | Discharge: 2023-11-17 | Disposition: A | Discharge status: Home / Self Care | Source: Ambulatory Visit | Attending: Nurse Practitioner | Admitting: Nurse Practitioner

## 2023-11-17 ENCOUNTER — Telehealth: Payer: Self-pay | Admitting: Nurse Practitioner

## 2023-11-17 ENCOUNTER — Telehealth: Payer: Self-pay | Admitting: Urology

## 2023-11-17 DIAGNOSIS — R3989 Other symptoms and signs involving the genitourinary system: Secondary | ICD-10-CM | POA: Diagnosis not present

## 2023-11-17 DIAGNOSIS — Z87442 Personal history of urinary calculi: Secondary | ICD-10-CM | POA: Insufficient documentation

## 2023-11-17 DIAGNOSIS — R350 Frequency of micturition: Secondary | ICD-10-CM | POA: Insufficient documentation

## 2023-11-17 DIAGNOSIS — N368 Other specified disorders of urethra: Secondary | ICD-10-CM | POA: Insufficient documentation

## 2023-11-17 LAB — POCT URINE DIPSTICK
Bilirubin, UA: NEGATIVE
Glucose, UA: NEGATIVE mg/dL
Ketones, POC UA: NEGATIVE mg/dL
Leukocytes, UA: NEGATIVE
Nitrite, UA: NEGATIVE
Protein Ur, POC: NEGATIVE mg/dL
Spec Grav, UA: 1.025 (ref 1.010–1.025)
Urobilinogen, UA: 0.2 U/dL
pH, UA: 6 (ref 5.0–8.0)

## 2023-11-17 LAB — POCT URINE PREGNANCY: Preg Test, Ur: NEGATIVE

## 2023-11-17 MED ORDER — PHENAZOPYRIDINE HCL 100 MG PO TABS: 100.0000 mg | ORAL_TABLET | Freq: Three times a day (TID) | ORAL | 0 refills | Status: AC | PRN

## 2023-11-17 MED ORDER — TAMSULOSIN HCL 0.4 MG PO CAPS: 0.4000 mg | ORAL_CAPSULE | Freq: Every day | ORAL | 0 refills | Status: AC

## 2023-11-17 NOTE — ED Provider Notes (Signed)
 [facility]    CSN: [id number] Arrival date & time: 11/17/23  1245      History   Chief Complaint Chief Complaint  Patient presents with   Urinary Frequency    HPI Megan Barton is a 33 y.o. female.   The history is provided by the patient.   Patient presents with a 1 month history of urethral pain, urinary frequency, and urethral spasms.  Patient denies fever, chills, hematuria, decreased urine stream, flank pain, low back pain, or vaginal symptoms.  Patient reports that she did see her PCP who treated her for a suspected UTI.  She states symptoms never completely resolved.  Patient states that she has seen urology in the past that she has a history of kidney stones.  States that she has had lithotripsy in the past due to a stone being stuck in her ureter.  Patient states I do not feel good.  She states that she did reach out to her PCP into urology.  Reported that urology told her it would be months before she could be seen and recommended a referral be placed that she could be seen sooner.  Patient states that symptoms feel similar to how she felt when she had a kidney stone in the past.  Past Medical History:  Diagnosis Date   Anemia    Heartburn in pregnancy    zantac   Obesity 01/06/2013   UTI (lower urinary tract infection) 01/06/2013   resolved    Patient Active Problem List   Diagnosis Date Noted   Gastroesophageal reflux disease 12/26/2021   Alternating constipation and diarrhea 12/26/2021   Rectal bleeding 12/26/2021   Dysphagia 12/26/2021   Calculus of gallbladder without cholecystitis without obstruction 12/11/2021   Ureteral calculus; left, distal 11/28/2021   Nephrolithiasis 11/28/2021   Preeclampsia in postpartum period 05/11/2020   Hypertension in pregnancy, preeclampsia, severe, delivered/postpartum 05/10/2020   Previous cesarean section 05/03/2020   Previous cesarean section 05/03/2020   Cesarean delivery delivered 05/03/2020   S/P  cesarean section 02/10/2014   Obesity in pregnancy, antepartum 07/21/2013   Supervision of normal first pregnancy 06/22/2013    Past Surgical History:  Procedure Laterality Date   CESAREAN SECTION N/A 02/10/2014   Procedure: CESAREAN SECTION;  Surgeon: Duwaine Blumenthal, DO;  Location: [facility];  Service: Obstetrics;  Laterality: N/A;  Primary edc 02/09/14   CESAREAN SECTION N/A 09/29/2017   Procedure: REPEAT CESAREAN SECTION;  Surgeon: Blumenthal Duwaine, DO;  [facility];  Service: Obstetrics;  Laterality: N/A;  Repeat edc 7/5 allergy to Keflex need RNFA   CESAREAN SECTION N/A 05/03/2020   Procedure: CESAREAN SECTION;  Surgeon: Marget Lenis, MD;  Location[facility];  Service: Obstetrics;  Laterality: N/A;   EXTRACORPOREAL SHOCK WAVE LITHOTRIPSY Left 12/18/2021   Procedure: EXTRACORPOREAL SHOCK WAVE LITHOTRIPSY (ESWL);  Surgeon: Roseann Adine PARAS., MD;  Location: [facility];  Service: Urology;  Laterality: Left;  pt knows to arrive at 8:00   WISDOM TOOTH EXTRACTION      OB History     Gravida  3   Para  3   Term  3   Preterm  0   AB  0   Living  3      SAB  0   IAB  0   Ectopic  0   Multiple  0   Live Births  3            Home Medications    Prior to Admission  medications   Medication Sig Start Date End Date Taking? Authorizing Provider  LARIN FE 1.5/30 1.5-30 MG-MCG tablet Take 1 tablet by mouth daily. 10/24/23  Yes [provider]  pantoprazole  (PROTONIX ) 40 MG tablet Take 1 tablet (40 mg total) by mouth daily before breakfast. Patient not taking: Reported on 02/20/2023 12/26/21   Rudy Josette RAMAN, PA-C    Family History Family History  Problem Relation Age of Onset   Diabetes Mother    Depression Mother    Hypertension Father    Hyperlipidemia Father    Asthma Brother    Cancer Paternal Grandfather        colon (70s), liver   Cancer Paternal Aunt        breast   Inflammatory bowel disease Neg Hx     Social History Social  History   Tobacco Use   Smoking status: Never   Smokeless tobacco: Never  Vaping Use   Vaping status: Never Used  Substance Use Topics   Alcohol use: No   Drug use: No     Allergies   Keflex [cephalexin]   Review of Systems Review of Systems Per HPI  Physical Exam Triage Vital Signs ED Triage Vitals [11/17/23 1325]  Encounter Vitals Group     BP (!) 147/94     Girls Systolic BP Percentile      Girls Diastolic BP Percentile      Boys Systolic BP Percentile      Boys Diastolic BP Percentile      Pulse Rate 81     Resp 18     Temp 98.8 F (37.1 C)     Temp Source Oral     SpO2 98 %     Weight      Height      Head Circumference      Peak Flow      Pain Score 5     Pain Loc      Pain Education      Exclude from Growth Chart    No data found.  Updated Vital Signs BP (!) 147/94 (BP Location: Right Arm)   Pulse 81   Temp 98.8 F (37.1 C) (Oral)   Resp 18   LMP 10/20/2023 (Exact Date)   SpO2 98%   Visual Acuity Right Eye Distance:   Left Eye Distance:   Bilateral Distance:    Right Eye Near:   Left Eye Near:    Bilateral Near:     Physical Exam Vitals and nursing note reviewed.  Constitutional:      General: She is not in acute distress.    Appearance: Normal appearance.  HENT:     Head: Normocephalic.  Cardiovascular:     Rate and Rhythm: Normal rate and regular rhythm.     Pulses: Normal pulses.     Heart sounds: Normal heart sounds.  Pulmonary:     Effort: Pulmonary effort is normal. No respiratory distress.     Breath sounds: Normal breath sounds. No stridor. No wheezing, rhonchi or rales.  Abdominal:     General: Bowel sounds are normal.     Palpations: Abdomen is soft.  Musculoskeletal:     Cervical back: Normal range of motion.  Skin:    General: Skin is warm and dry.  Neurological:     General: No focal deficit present.     Mental Status: She is alert and oriented to person, place, and time.  Psychiatric:  Mood and  Affect: Mood normal.        Behavior: Behavior normal.      UC Treatments / Results  Labs (all labs ordered are listed, but only abnormal results are displayed) Labs Reviewed  POCT URINE DIPSTICK - Abnormal; Notable for the following components:      Result Value   Blood, UA trace-intact (*)    All other components within normal limits  POCT URINE PREGNANCY - Normal    EKG   Radiology No results found.  Procedures Procedures (including critical care time)  Medications Ordered in UC Medications - No data to display  Initial Impression / Assessment and Plan / UC Course  I have reviewed the triage vital signs and the nursing notes.  Pertinent labs & imaging results that were available during my care of the patient were reviewed by me and considered in my medical decision making (see chart for details).  Will order KUB to see if a kidney stone can be visualized.  In the interim, will start tamsulosin  0.4 mg in the event that this is a stone, Pyridium  100 mg for dysuria, and referral placed to her previous urologist Dr. Roseann.  Supportive care recommendations were provided discussed with the patient to include over-the-counter analgesics, fluids, rest, developing a toileting schedule, and avoiding caffeine .  Patient advised to call Dr. Annis office to schedule an appointment as soon as possible.  Patient was also given strict ER follow-up precautions.  Patient was in agreement with this plan of care and verbalized understanding.  All questions were answered.  Patient stable for discharge.  Final Clinical Impressions(s) / UC Diagnoses   Final diagnoses:  None   Discharge Instructions   None    ED Prescriptions   None    PDMP not reviewed this encounter.   Gilmer Etta PARAS, NP 11/17/23 1433

## 2023-11-17 NOTE — Telephone Encounter (Signed)
 Pt called about getting folllow up apt for [facility] for Urethral pain pt wants to be seen earlier then september  I suggest she go to her pcp and  see what they recommend until she can see [facility]. Pt went to urgent care In [place] and they sent her for x ray to check for stones then suggested she follow up with [facility]. I will msg [facility] once xray comes back to see if we can get  her in sooner.

## 2023-11-17 NOTE — Discharge Instructions (Addendum)
 Please go to [facility] for an x-ray of your abdomen.  You will be contacted when the results of the x-ray are received.  You will also access to your results via MyChart. A referral has been placed for you for Dr. Annis office.  Please call to schedule an appointment as soon as possible. Take medication as prescribed. Increase fluids.  Make sure you are drinking at least 8-10 8 ounce glasses of water  daily. Develop a toileting schedule that will allow you to urinate at least every 2 hours. Avoid caffeine  such as tea, soda, or coffee while symptoms persist. Go to the emergency department immediately if you experience worsening urethral pain, or develop new symptoms of fever, chills, blood in urine, or other concerns. Follow-up as needed.

## 2023-11-17 NOTE — Telephone Encounter (Signed)
 Received patient's KUB results.  Call patient to discuss x-ray results.  Reached voicemail, left message for patient to return the phone call.  Patient returned phone call.  Verified patient's identification with 2 patient identifiers.  Patient was advised that the KUB did not show any signs of a kidney stone.  Will proceed with current treatment recommendations, patient has called the urologist and is scheduled for next week.  Reiterated follow-up in the emergency department if needed for worsening symptoms.  Patient was in agreement with this plan of care and verbalized understanding.  All questions were answered.

## 2023-11-17 NOTE — ED Triage Notes (Signed)
 Pt states she is having pain in her urethra,  urinary frequency, urethral spasms x 1 month.

## 2023-11-18 ENCOUNTER — Ambulatory Visit (HOSPITAL_COMMUNITY): Payer: Self-pay

## 2023-11-18 NOTE — Telephone Encounter (Signed)
 Called pt to offer earlier apt because we had an opening but the patient was not able to take it due to other appts. She stated she is fine to wait until the 20th of August.

## 2023-11-20 LAB — URINE CULTURE: Culture: >=100000 — AB

## 2023-11-20 MED ORDER — FLUCONAZOLE 150 MG PO TABS: 150.0000 mg | ORAL_TABLET | Freq: Once | ORAL | 0 refills | Status: AC

## 2023-11-20 MED ORDER — SULFAMETHOXAZOLE-TRIMETHOPRIM 800-160 MG PO TABS: 1.0000 | ORAL_TABLET | Freq: Two times a day (BID) | ORAL | 0 refills | Status: AC

## 2023-11-26 ENCOUNTER — Ambulatory Visit: Admitting: Urology
